# Patient Record
Sex: Male | Born: 1990 | Race: White | Hispanic: No | Marital: Married | Smoking: Never smoker
Health system: Southern US, Community
[De-identification: ages and names within clinical notes are randomized; demographics above are authoritative.]

## PROBLEM LIST (undated history)

## (undated) DIAGNOSIS — K219 Gastro-esophageal reflux disease without esophagitis: Secondary | ICD-10-CM

## (undated) DIAGNOSIS — T7840XA Allergy, unspecified, initial encounter: Secondary | ICD-10-CM

## (undated) DIAGNOSIS — F419 Anxiety disorder, unspecified: Secondary | ICD-10-CM

## (undated) DIAGNOSIS — M1990 Unspecified osteoarthritis, unspecified site: Secondary | ICD-10-CM

## (undated) DIAGNOSIS — I10 Essential (primary) hypertension: Secondary | ICD-10-CM

## (undated) DIAGNOSIS — F32A Depression, unspecified: Secondary | ICD-10-CM

## (undated) DIAGNOSIS — G4730 Sleep apnea, unspecified: Secondary | ICD-10-CM

## (undated) DIAGNOSIS — K8020 Calculus of gallbladder without cholecystitis without obstruction: Secondary | ICD-10-CM

## (undated) DIAGNOSIS — J309 Allergic rhinitis, unspecified: Secondary | ICD-10-CM

## (undated) HISTORY — DX: Essential (primary) hypertension: I10

## (undated) HISTORY — DX: Sleep apnea, unspecified: G47.30

## (undated) HISTORY — DX: Anxiety disorder, unspecified: F41.9

## (undated) HISTORY — PX: NASAL POLYP EXCISION: SHX2068

## (undated) HISTORY — DX: Allergic rhinitis, unspecified: J30.9

## (undated) HISTORY — DX: Unspecified osteoarthritis, unspecified site: M19.90

## (undated) HISTORY — DX: Allergy, unspecified, initial encounter: T78.40XA

## (undated) HISTORY — DX: Calculus of gallbladder without cholecystitis without obstruction: K80.20

## (undated) HISTORY — DX: Depression, unspecified: F32.A

## (undated) HISTORY — DX: Gastro-esophageal reflux disease without esophagitis: K21.9

---

## 2016-08-02 DIAGNOSIS — J029 Acute pharyngitis, unspecified: Secondary | ICD-10-CM | POA: Diagnosis not present

## 2016-08-02 DIAGNOSIS — R6889 Other general symptoms and signs: Secondary | ICD-10-CM | POA: Diagnosis not present

## 2016-08-02 DIAGNOSIS — Z6841 Body Mass Index (BMI) 40.0 and over, adult: Secondary | ICD-10-CM | POA: Diagnosis not present

## 2016-08-02 DIAGNOSIS — E669 Obesity, unspecified: Secondary | ICD-10-CM | POA: Diagnosis not present

## 2016-08-02 DIAGNOSIS — E559 Vitamin D deficiency, unspecified: Secondary | ICD-10-CM | POA: Diagnosis not present

## 2016-08-22 DIAGNOSIS — R221 Localized swelling, mass and lump, neck: Secondary | ICD-10-CM | POA: Diagnosis not present

## 2016-08-22 DIAGNOSIS — Z0000 Encounter for general adult medical examination without abnormal findings: Secondary | ICD-10-CM | POA: Diagnosis not present

## 2017-06-02 DIAGNOSIS — R1011 Right upper quadrant pain: Secondary | ICD-10-CM | POA: Diagnosis not present

## 2017-06-02 DIAGNOSIS — Z8379 Family history of other diseases of the digestive system: Secondary | ICD-10-CM | POA: Diagnosis not present

## 2017-06-02 DIAGNOSIS — Z1331 Encounter for screening for depression: Secondary | ICD-10-CM | POA: Diagnosis not present

## 2017-06-07 DIAGNOSIS — K760 Fatty (change of) liver, not elsewhere classified: Secondary | ICD-10-CM | POA: Diagnosis not present

## 2017-06-07 DIAGNOSIS — K8020 Calculus of gallbladder without cholecystitis without obstruction: Secondary | ICD-10-CM | POA: Diagnosis not present

## 2017-07-31 DIAGNOSIS — R7303 Prediabetes: Secondary | ICD-10-CM | POA: Diagnosis not present

## 2017-07-31 DIAGNOSIS — I10 Essential (primary) hypertension: Secondary | ICD-10-CM | POA: Diagnosis not present

## 2017-07-31 DIAGNOSIS — R42 Dizziness and giddiness: Secondary | ICD-10-CM | POA: Diagnosis not present

## 2017-08-18 DIAGNOSIS — J019 Acute sinusitis, unspecified: Secondary | ICD-10-CM | POA: Diagnosis not present

## 2017-10-03 DIAGNOSIS — J320 Chronic maxillary sinusitis: Secondary | ICD-10-CM | POA: Diagnosis not present

## 2017-10-03 DIAGNOSIS — R0681 Apnea, not elsewhere classified: Secondary | ICD-10-CM | POA: Diagnosis not present

## 2017-10-03 DIAGNOSIS — Z6841 Body Mass Index (BMI) 40.0 and over, adult: Secondary | ICD-10-CM | POA: Diagnosis not present

## 2017-10-03 DIAGNOSIS — M25511 Pain in right shoulder: Secondary | ICD-10-CM | POA: Diagnosis not present

## 2017-10-09 DIAGNOSIS — M7541 Impingement syndrome of right shoulder: Secondary | ICD-10-CM | POA: Diagnosis not present

## 2017-10-09 DIAGNOSIS — M5412 Radiculopathy, cervical region: Secondary | ICD-10-CM | POA: Diagnosis not present

## 2017-10-25 DIAGNOSIS — Z0189 Encounter for other specified special examinations: Secondary | ICD-10-CM | POA: Diagnosis not present

## 2017-10-25 DIAGNOSIS — M5412 Radiculopathy, cervical region: Secondary | ICD-10-CM | POA: Diagnosis not present

## 2017-10-31 DIAGNOSIS — G500 Trigeminal neuralgia: Secondary | ICD-10-CM | POA: Diagnosis not present

## 2017-10-31 DIAGNOSIS — M7541 Impingement syndrome of right shoulder: Secondary | ICD-10-CM | POA: Diagnosis not present

## 2017-10-31 DIAGNOSIS — M5412 Radiculopathy, cervical region: Secondary | ICD-10-CM | POA: Diagnosis not present

## 2017-11-08 DIAGNOSIS — R600 Localized edema: Secondary | ICD-10-CM | POA: Diagnosis not present

## 2017-11-08 DIAGNOSIS — M7541 Impingement syndrome of right shoulder: Secondary | ICD-10-CM | POA: Diagnosis not present

## 2017-11-08 DIAGNOSIS — M25511 Pain in right shoulder: Secondary | ICD-10-CM | POA: Diagnosis not present

## 2017-11-10 DIAGNOSIS — M7541 Impingement syndrome of right shoulder: Secondary | ICD-10-CM | POA: Diagnosis not present

## 2017-11-10 DIAGNOSIS — R600 Localized edema: Secondary | ICD-10-CM | POA: Diagnosis not present

## 2017-11-10 DIAGNOSIS — M25511 Pain in right shoulder: Secondary | ICD-10-CM | POA: Diagnosis not present

## 2017-11-16 DIAGNOSIS — M7541 Impingement syndrome of right shoulder: Secondary | ICD-10-CM | POA: Diagnosis not present

## 2017-11-16 DIAGNOSIS — R600 Localized edema: Secondary | ICD-10-CM | POA: Diagnosis not present

## 2017-11-16 DIAGNOSIS — M25511 Pain in right shoulder: Secondary | ICD-10-CM | POA: Diagnosis not present

## 2017-11-20 DIAGNOSIS — M5416 Radiculopathy, lumbar region: Secondary | ICD-10-CM | POA: Diagnosis not present

## 2017-11-20 DIAGNOSIS — M47812 Spondylosis without myelopathy or radiculopathy, cervical region: Secondary | ICD-10-CM | POA: Diagnosis not present

## 2017-11-22 DIAGNOSIS — M7541 Impingement syndrome of right shoulder: Secondary | ICD-10-CM | POA: Diagnosis not present

## 2017-11-22 DIAGNOSIS — M25511 Pain in right shoulder: Secondary | ICD-10-CM | POA: Diagnosis not present

## 2017-11-24 DIAGNOSIS — M7541 Impingement syndrome of right shoulder: Secondary | ICD-10-CM | POA: Diagnosis not present

## 2017-11-24 DIAGNOSIS — M25511 Pain in right shoulder: Secondary | ICD-10-CM | POA: Diagnosis not present

## 2017-11-27 DIAGNOSIS — M25511 Pain in right shoulder: Secondary | ICD-10-CM | POA: Diagnosis not present

## 2017-11-27 DIAGNOSIS — M7541 Impingement syndrome of right shoulder: Secondary | ICD-10-CM | POA: Diagnosis not present

## 2017-11-29 DIAGNOSIS — M545 Low back pain: Secondary | ICD-10-CM | POA: Diagnosis not present

## 2017-11-29 DIAGNOSIS — M6281 Muscle weakness (generalized): Secondary | ICD-10-CM | POA: Diagnosis not present

## 2017-11-29 DIAGNOSIS — M25551 Pain in right hip: Secondary | ICD-10-CM | POA: Diagnosis not present

## 2017-12-04 DIAGNOSIS — M6281 Muscle weakness (generalized): Secondary | ICD-10-CM | POA: Diagnosis not present

## 2017-12-04 DIAGNOSIS — M25511 Pain in right shoulder: Secondary | ICD-10-CM | POA: Diagnosis not present

## 2017-12-04 DIAGNOSIS — M545 Low back pain: Secondary | ICD-10-CM | POA: Diagnosis not present

## 2017-12-04 DIAGNOSIS — M25551 Pain in right hip: Secondary | ICD-10-CM | POA: Diagnosis not present

## 2017-12-06 ENCOUNTER — Other Ambulatory Visit: Payer: Self-pay | Admitting: Orthopedic Surgery

## 2017-12-06 DIAGNOSIS — M25511 Pain in right shoulder: Secondary | ICD-10-CM

## 2017-12-07 ENCOUNTER — Encounter: Payer: Self-pay | Admitting: Family Medicine

## 2017-12-07 ENCOUNTER — Ambulatory Visit (INDEPENDENT_AMBULATORY_CARE_PROVIDER_SITE_OTHER): Payer: BLUE CROSS/BLUE SHIELD | Admitting: Family Medicine

## 2017-12-07 VITALS — BP 144/102 | HR 80 | Temp 98.5°F | Resp 16 | Ht 70.0 in | Wt >= 6400 oz

## 2017-12-07 DIAGNOSIS — Z6841 Body Mass Index (BMI) 40.0 and over, adult: Secondary | ICD-10-CM | POA: Diagnosis not present

## 2017-12-07 DIAGNOSIS — R7303 Prediabetes: Secondary | ICD-10-CM | POA: Insufficient documentation

## 2017-12-07 DIAGNOSIS — K219 Gastro-esophageal reflux disease without esophagitis: Secondary | ICD-10-CM

## 2017-12-07 DIAGNOSIS — J301 Allergic rhinitis due to pollen: Secondary | ICD-10-CM

## 2017-12-07 DIAGNOSIS — J309 Allergic rhinitis, unspecified: Secondary | ICD-10-CM | POA: Insufficient documentation

## 2017-12-07 DIAGNOSIS — M25511 Pain in right shoulder: Secondary | ICD-10-CM | POA: Insufficient documentation

## 2017-12-07 DIAGNOSIS — R0683 Snoring: Secondary | ICD-10-CM | POA: Diagnosis not present

## 2017-12-07 DIAGNOSIS — I10 Essential (primary) hypertension: Secondary | ICD-10-CM | POA: Diagnosis not present

## 2017-12-07 DIAGNOSIS — K8020 Calculus of gallbladder without cholecystitis without obstruction: Secondary | ICD-10-CM | POA: Diagnosis not present

## 2017-12-07 DIAGNOSIS — Z13220 Encounter for screening for lipoid disorders: Secondary | ICD-10-CM | POA: Diagnosis not present

## 2017-12-07 DIAGNOSIS — R739 Hyperglycemia, unspecified: Secondary | ICD-10-CM

## 2017-12-07 DIAGNOSIS — G4719 Other hypersomnia: Secondary | ICD-10-CM | POA: Diagnosis not present

## 2017-12-07 DIAGNOSIS — Z833 Family history of diabetes mellitus: Secondary | ICD-10-CM

## 2017-12-07 DIAGNOSIS — I152 Hypertension secondary to endocrine disorders: Secondary | ICD-10-CM | POA: Insufficient documentation

## 2017-12-07 DIAGNOSIS — E119 Type 2 diabetes mellitus without complications: Secondary | ICD-10-CM | POA: Insufficient documentation

## 2017-12-07 MED ORDER — RANITIDINE HCL 150 MG PO TABS: 150.0000 mg | ORAL_TABLET | Freq: Two times a day (BID) | ORAL | 2 refills | Status: DC

## 2017-12-07 MED ORDER — HYDROCHLOROTHIAZIDE 12.5 MG PO TABS
12.5000 mg | ORAL_TABLET | Freq: Every day | ORAL | 3 refills | Status: DC
Start: 2017-12-07 — End: 2018-01-09

## 2017-12-07 NOTE — Assessment & Plan Note (Signed)
Currently uncontrolled Patient reports normal blood pressures at home, so it is possible that some of the elevation today is related to whitecoat syndrome at a new doctor's office Gave patient the choice of monitoring for 1 month or going ahead and starting a medication We will start HCTZ 12.5 mg daily and discontinue the lisinopril portion of his medication that he is not currently taking Check CMP Patient to keep a log of home blood pressures and bring cuff to next visit so we can compare to ours Follow-up in 1 month

## 2017-12-07 NOTE — Progress Notes (Signed)
Patient: Jeremy David, Male    DOB: [birth year], 27 y.o.   MRN: [id number] Visit Date: 12/07/2017  Today's Provider: Shirlee Latch, MD   I, Joslyn Hy, CMA, am acting as scribe for Shirlee Latch, MD.  Chief Complaint  Patient presents with  . New Patient (Initial Visit)   Subjective:    New Patient Fernandez Kenley is a 27 y.o. male who presents today as a new pt to establish care. He feels well. He reports exercising some; "I try to exercise".  He was previously seen by [facility] in [place].  He is now living in [place].   He reports he is sleeping poorly. Has been told he has apneic episodes and significant snoring.  Has never had a sleep study.  Previous PCP was trying to get this processed, but insurance was declining.  Epworth Sleepiness Scale:  Sitting and reading: Moderate chance of dozing Watching TV: Moderate chance of dozing Sitting, inactive in a public place (e.g. a theatre or a meeting): Slight chance of dozing As a passenger in a car for an hour without a break: Moderate chance of dozing Lying down to rest in the afternoon when circumstances permit: Moderate chance of dozing Sitting and talking to someone: Slight chance of dozing Sitting quietly after a lunch without alcohol: Slight chance of dozing In a car, while stopped for a few minutes in traffic: Slight chance of dozing  Total score: 12   Pt has a H/O HTN. He was prescribed lisinnopril-HCTZ 10-12.5 by previous PCP. He states he is not taking this medication because he believed it was ineffective. He checks his BP at home, which is normally 112/89-90. He was also informed that he has an intermittent irregular heart beat.  Was told that he had gallstones after having a RUQ [place] with previous PCP.  Had RUQ pain at that time after eating.  Has cut back on greasy food intake and spicy foods. Now RUQ pain has resolved.  No more gallbladder attacks.  Was told to hold off on surgery if possible  and wants another opinion on this.  Patient is also concerned about chest pain.  The pain is in the center of his upper chest and up into his throat.  Is been occurring intermittently for the last 2 weeks.  Seems to come on randomly and often at rest.  He notices better on days that he is more active lasts for a few hours after coming on.  He has not tried any medications for it.  It is associated with nausea that is particularly bad in the mornings, but no vomiting.  Also with dry cough and occasional diaphoresis when the pain is bad.  He has never had pain like this before, though he was having some burning in his epigastrium/right upper quadrant when eating spicy foods prior to finding out that he had gallstones.  Patient also reports intermittent dry mouth within the last month or so.  Allergic rhinitis: Patient is not currently taking any medications for this.  He finds it is seasonal and related to the pollen.  He states that after taking a lot of Allegra-D as a teenager that antihistamines do not seem to help much anymore.  Seeing [facility] for R shoulder and neck pain.  Undergoing PT and planning for MRI next week.  We will attempt to obtain records  Hyperglycemia: Patient states this is a long-standing history of intermittent hyperglycemia.  He is concerned because he has  a family history of diabetes in his mother.  He is not checking his blood sugar regularly, but did have a random blood sugar of 166 in the middle of the day last week. -----------------------------------------------------------------   Review of Systems  Constitutional: Positive for appetite change, chills, diaphoresis, fatigue and unexpected weight change. Negative for activity change and fever.  HENT: Positive for congestion, drooling, ear pain, rhinorrhea, sinus pressure, sinus pain, sneezing, tinnitus and trouble swallowing. Negative for dental problem, ear discharge, facial swelling, hearing loss, mouth sores,  nosebleeds, postnasal drip, sore throat and voice change.   Eyes: Positive for photophobia and pain. Negative for discharge, redness, itching and visual disturbance.  Respiratory: Positive for apnea, cough, choking, chest tightness and shortness of breath. Negative for wheezing and stridor.   Cardiovascular: Positive for palpitations. Negative for chest pain and leg swelling.  Gastrointestinal: Positive for nausea. Negative for abdominal distention, abdominal pain, anal bleeding, blood in stool, constipation, diarrhea, rectal pain and vomiting.  Endocrine: Positive for heat intolerance and polydipsia. Negative for cold intolerance, polyphagia and polyuria.  Genitourinary: Negative.   Musculoskeletal: Positive for arthralgias, back pain, myalgias, neck pain and neck stiffness. Negative for gait problem and joint swelling.  Skin: Negative.   Allergic/Immunologic: Positive for environmental allergies. Negative for food allergies and immunocompromised state.  Neurological: Positive for dizziness, weakness, light-headedness, numbness and headaches. Negative for tremors, seizures, syncope, facial asymmetry and speech difficulty.  Hematological: Negative.   Psychiatric/Behavioral: Positive for confusion, dysphoric mood and sleep disturbance. Negative for agitation, behavioral problems, decreased concentration, hallucinations, self-injury and suicidal ideas. The patient is nervous/anxious. The patient is not hyperactive.     Social History      He  reports that he has never smoked. He has never used smokeless tobacco. He reports that he drank alcohol. He reports that he does not use drugs.       Social History   Socioeconomic History  . Marital status: Married    Spouse name: Lillia Abed  . Number of children: 0  . Years of education: 40  . Highest education level: Some college, no degree  Occupational History    Employer: [employer]    Comment: Currently out of work due to shoulder  injury  Social Needs  . Financial resource strain: Not on file  . Food insecurity:    Worry: Not on file    Inability: Not on file  . Transportation needs:    Medical: Not on file    Non-medical: Not on file  Tobacco Use  . Smoking status: Never Smoker  . Smokeless tobacco: Never Used  Substance and Sexual Activity  . Alcohol use: Not Currently  . Drug use: Never  . Sexual activity: Yes    Partners: Female    Birth control/protection: Condom  Lifestyle  . Physical activity:    Days per week: Not on file    Minutes per session: Not on file  . Stress: Not on file  Relationships  . Social connections:    Talks on phone: Not on file    Gets together: Not on file    Attends religious service: Not on file    Active member of club or organization: Not on file    Attends meetings of clubs or organizations: Not on file    Relationship status: Not on file  Other Topics Concern  . Not on file  Social History Narrative  . Not on file    Past Medical History:  Diagnosis Date  .  Allergic rhinitis   . Gallstones   . Hypertension      Patient Active Problem List   Diagnosis Date Noted  . Hypertension 12/07/2017  . Hyperglycemia 12/07/2017  . Allergic rhinitis 12/07/2017  . Gallstones 12/07/2017  . GERD (gastroesophageal reflux disease) 12/07/2017  . Obesity 12/07/2017  . Excessive daytime sleepiness 12/07/2017  . Right shoulder pain 12/07/2017    Past Surgical History:  Procedure Laterality Date  . NASAL POLYP EXCISION      Family History        Family Status  Relation Name Status  . Mother  Alive  . Father  (Not Specified)  . Mat Aunt  (Not Specified)  . MGM  (Not Specified)  . Neg Hx  (Not Specified)        His family history includes Breast cancer in his maternal aunt; Cancer in his maternal grandmother; Diabetes in his mother; Hypertension in his father. There is no history of Colon cancer or Prostate cancer.      Allergies  Allergen Reactions  . Molds  & Smuts   . Pollen Extract Other (See Comments)    Sneezing and wheezing     Current Outpatient Medications:  .  hydrochlorothiazide (HYDRODIURIL) 12.5 MG tablet, Take 1 tablet (12.5 mg total) by mouth daily., Disp: 30 tablet, Rfl: 3 .  ranitidine (ZANTAC) 150 MG tablet, Take 1 tablet (150 mg total) by mouth 2 (two) times daily., Disp: 60 tablet, Rfl: 2   Patient Care Team: Erasmo Downer, MD as PCP - General (Family Medicine)      Objective:   Vitals: BP (!) 144/102 (BP Location: Left Arm, Patient Position: Sitting, Cuff Size: Large)   Pulse 80   Temp 98.5 F (36.9 C) (Oral)   Resp 16   Ht 5\' 10"  (1.778 m)   Wt (!) 406 lb (184.2 kg)   SpO2 97%   BMI 58.25 kg/m    Vitals:   12/07/17 0919  BP: (!) 144/102  Pulse: 80  Resp: 16  Temp: 98.5 F (36.9 C)  TempSrc: Oral  SpO2: 97%  Weight: (!) 406 lb (184.2 kg)  Height: 5\' 10"  (1.778 m)     Physical Exam  Constitutional: He is oriented to person, place, and time. He appears well-developed and well-nourished. No distress.  HENT:  Head: Normocephalic and atraumatic.  Right Ear: External ear normal.  Left Ear: External ear normal.  Nose: Mucosal edema present. No rhinorrhea. Right sinus exhibits no maxillary sinus tenderness and no frontal sinus tenderness. Left sinus exhibits no maxillary sinus tenderness and no frontal sinus tenderness.  Mouth/Throat: Oropharynx is clear and moist.  Eyes: Pupils are equal, round, and reactive to light. Conjunctivae and EOM are normal. Right eye exhibits no discharge. Left eye exhibits no discharge. No scleral icterus.  Neck: Neck supple. No thyromegaly present.  Cardiovascular: Normal rate, regular rhythm, normal heart sounds and intact distal pulses.  No murmur heard. Pulmonary/Chest: Effort normal and breath sounds normal. No respiratory distress. He has no wheezes. He has no rales.  Abdominal: Soft. Bowel sounds are normal. He exhibits no distension. There is tenderness (TTP over  epigastrium). There is no rebound and no guarding.  Musculoskeletal: He exhibits no edema or deformity.  Lymphadenopathy:    He has no cervical adenopathy.  Neurological: He is alert and oriented to person, place, and time.  Skin: Skin is warm and dry. Capillary refill takes less than 2 seconds. No rash noted.  Psychiatric: He has a normal  mood and affect. His behavior is normal.  Vitals reviewed.    Depression Screen PHQ 2/9 Scores 12/07/2017 12/07/2017  PHQ - 2 Score 0 0  PHQ- 9 Score 3 -     Assessment & Plan:    Problem List Items Addressed This Visit      Cardiovascular and Mediastinum   Hypertension - Primary    Currently uncontrolled Patient reports normal blood pressures at home, so it is possible that some of the elevation today is related to whitecoat syndrome at a new doctor's office Gave patient the choice of monitoring for 1 month or going ahead and starting a medication We will start HCTZ 12.5 mg daily and discontinue the lisinopril portion of his medication that he is not currently taking Check CMP Patient to keep a log of home blood pressures and bring cuff to next visit so we can compare to ours Follow-up in 1 month      Relevant Medications   hydrochlorothiazide (HYDRODIURIL) 12.5 MG tablet   Other Relevant Orders   Comprehensive metabolic panel     Respiratory   Allergic rhinitis    Uncontrolled but not currently taking any medications Patient reports that he has tried antihistamines multiple times and they do not seem to help He knows to avoid chronic Sudafed Could consider Flonase in the future        Digestive   Gallstones    We will attempt to obtain records from previous PCP regarding imaging He is asymptomatic currently Discussed with patient that if he is asymptomatic, as long as bile duct was not blocked, he can continue to just watch his diet and not have surgery at this time If he becomes symptomatic again in the future, we will send  referral to Gen Surg      GERD (gastroesophageal reflux disease)    The characteristics of patient's chest pain are not consistent with cardiac etiology It is reassuring that it occurs mostly at rest and gets better with activity Duration of pain is also not consistent with cardiac etiology Given that is a burning pain and associated with epigastric pain and radiates into the neck, suspect this is GERD We will try H2 blocker Could escalate PPI in the future Also suspect that some of his right upper quadrant pain and burning with spicy foods was related to GERD as well as his gallstone disease Strict return precautions discussed Follow-up in 1 month      Relevant Medications   ranitidine (ZANTAC) 150 MG tablet     Other   Hyperglycemia    Patient does not have extensive logs of blood sugars, but has been having some hypoglycemic events at home Check A1c, especially given family history of diabetes and significant obesity Treatment pending A1c value      Relevant Orders   Hemoglobin A1c   Obesity    Patient with severe obesity with BMI 58 Discussed importance of healthy weight management, diet, exercise This likely contributes to his possible sleep apnea He may be a good candidate for bariatric surgery in the future if he was interested given his comorbidities such as possible sleep apnea and hypertension      Relevant Orders   Comprehensive metabolic panel   Lipid panel   Hemoglobin A1c   Excessive daytime sleepiness    Concern for possible OSA given morbid obesity with large neck circumference, snoring, possible apneic events Discussed consequences of untreated sleep apnea Referral for sleep study      Relevant  Orders   Ambulatory referral to Sleep Studies   Right shoulder pain    Followed by [facility] in [place] We will send ROI to attempt to get records as patient is unclear about what his diagnosis is       Other Visit Diagnoses    Screening for lipid  disorders       Relevant Orders   Comprehensive metabolic panel   Lipid panel   Family history of diabetes mellitus       Relevant Orders   Hemoglobin A1c   Snoring       Relevant Orders   Ambulatory referral to Sleep Studies       Return in about 1 month (around 01/04/2018) for HTN and GERD f/u.  Will send ROI to [facility] and previous PCP   The entirety of the information documented in the History of Present Illness, Review of Systems and Physical Exam were personally obtained by me. Portions of this information were initially documented by Irving BurtonEmily Ratchford, CMA and reviewed by me for thoroughness and accuracy.    Erasmo DownerBacigalupo, Jaydien Panepinto M, MD, MPH [facility] 12/07/2017 12:04 PM

## 2017-12-07 NOTE — Assessment & Plan Note (Signed)
The characteristics of patient's chest pain are not consistent with cardiac etiology It is reassuring that it occurs mostly at rest and gets better with activity Duration of pain is also not consistent with cardiac etiology Given that is a burning pain and associated with epigastric pain and radiates into the neck, suspect this is GERD We will try H2 blocker Could escalate PPI in the future Also suspect that some of his right upper quadrant pain and burning with spicy foods was related to GERD as well as his gallstone disease Strict return precautions discussed Follow-up in 1 month

## 2017-12-07 NOTE — Assessment & Plan Note (Signed)
Concern for possible OSA given morbid obesity with large neck circumference, snoring, possible apneic events Discussed consequences of untreated sleep apnea Referral for sleep study

## 2017-12-07 NOTE — Assessment & Plan Note (Signed)
Patient with severe obesity with BMI 58 Discussed importance of healthy weight management, diet, exercise This likely contributes to his possible sleep apnea He may be a good candidate for bariatric surgery in the future if he was interested given his comorbidities such as possible sleep apnea and hypertension

## 2017-12-07 NOTE — Assessment & Plan Note (Signed)
[place] [facility] [place] We will send ROI to attempt to get records as patient is unclear about what his diagnosis is

## 2017-12-07 NOTE — Assessment & Plan Note (Signed)
We will attempt to obtain records from previous PCP regarding imaging He is asymptomatic currently Discussed with patient that if he is asymptomatic, as long as bile duct was not blocked, he can continue to just watch his diet and not have surgery at this time If he becomes symptomatic again in the future, we will send referral to Gen Surg

## 2017-12-07 NOTE — Assessment & Plan Note (Signed)
Patient does not have extensive logs of blood sugars, but has been having some hypoglycemic events at home Check A1c, especially given family history of diabetes and significant obesity Treatment pending A1c value

## 2017-12-07 NOTE — Assessment & Plan Note (Signed)
Uncontrolled but not currently taking any medications Patient reports that he has tried antihistamines multiple times and they do not seem to help He knows to avoid chronic Sudafed Could consider Flonase in the future

## 2017-12-07 NOTE — Patient Instructions (Signed)

## 2017-12-08 DIAGNOSIS — Z13220 Encounter for screening for lipoid disorders: Secondary | ICD-10-CM | POA: Diagnosis not present

## 2017-12-08 DIAGNOSIS — I10 Essential (primary) hypertension: Secondary | ICD-10-CM | POA: Diagnosis not present

## 2017-12-08 DIAGNOSIS — R739 Hyperglycemia, unspecified: Secondary | ICD-10-CM | POA: Diagnosis not present

## 2017-12-08 DIAGNOSIS — Z6841 Body Mass Index (BMI) 40.0 and over, adult: Secondary | ICD-10-CM | POA: Diagnosis not present

## 2017-12-09 LAB — COMPREHENSIVE METABOLIC PANEL
A/G RATIO: 2.1 (ref 1.2–2.2)
ALT: 55 IU/L — ABNORMAL HIGH (ref 0–44)
AST: 26 IU/L (ref 0–40)
Albumin: 4.4 g/dL (ref 3.5–5.5)
Alkaline Phosphatase: 17 IU/L — ABNORMAL LOW (ref 39–117)
BILIRUBIN TOTAL: 1.2 mg/dL (ref 0.0–1.2)
BUN/Creatinine Ratio: 11 (ref 9–20)
BUN: 8 mg/dL (ref 6–20)
CALCIUM: 9.5 mg/dL (ref 8.7–10.2)
CHLORIDE: 103 mmol/L (ref 96–106)
CO2: 19 mmol/L — ABNORMAL LOW (ref 20–29)
Creatinine, Ser: 0.76 mg/dL (ref 0.76–1.27)
GFR, EST AFRICAN AMERICAN: 144 mL/min/{1.73_m2} (ref >59)
GFR, EST NON AFRICAN AMERICAN: 124 mL/min/{1.73_m2} (ref >59)
GLOBULIN, TOTAL: 2.1 g/dL (ref 1.5–4.5)
Glucose: 106 mg/dL — ABNORMAL HIGH (ref 65–99)
POTASSIUM: 4.5 mmol/L (ref 3.5–5.2)
SODIUM: 140 mmol/L (ref 134–144)
TOTAL PROTEIN: 6.5 g/dL (ref 6.0–8.5)

## 2017-12-09 LAB — LIPID PANEL
CHOL/HDL RATIO: 5.3 ratio — AB (ref 0.0–5.0)
Cholesterol, Total: 223 mg/dL — ABNORMAL HIGH (ref 100–199)
HDL: 42 mg/dL (ref >39)
LDL Calculated: 162 mg/dL — ABNORMAL HIGH (ref 0–99)
Triglycerides: 96 mg/dL (ref 0–149)
VLDL Cholesterol Cal: 19 mg/dL (ref 5–40)

## 2017-12-09 LAB — HEMOGLOBIN A1C
ESTIMATED AVERAGE GLUCOSE: 120 mg/dL
HEMOGLOBIN A1C: 5.8 % — AB (ref 4.8–5.6)

## 2017-12-12 ENCOUNTER — Telehealth: Payer: Self-pay

## 2017-12-12 DIAGNOSIS — H5203 Hypermetropia, bilateral: Secondary | ICD-10-CM | POA: Diagnosis not present

## 2017-12-12 DIAGNOSIS — M25511 Pain in right shoulder: Secondary | ICD-10-CM | POA: Diagnosis not present

## 2017-12-12 DIAGNOSIS — R600 Localized edema: Secondary | ICD-10-CM | POA: Diagnosis not present

## 2017-12-12 DIAGNOSIS — M7541 Impingement syndrome of right shoulder: Secondary | ICD-10-CM | POA: Diagnosis not present

## 2017-12-12 NOTE — Telephone Encounter (Signed)
Pt advised and acknowledges understanding.  

## 2017-12-12 NOTE — Telephone Encounter (Signed)
-----   Message from Erasmo DownerAngela M Bacigalupo, MD sent at 12/11/2017  3:01 PM EDT ----- Normal kidney function, liver function, electrolytes, except ALT is slightly elevated. Cut back on alcohol and tylenol use.  Cholesterol is high, but not high enough to warrant medication.  We will monitor annually.  Recommend diet low in saturated fats and 150min of exercise per week.  A1c is in prediabetes range.  Recommend low carb diet and exercise.  We will recheck this every 6-12 months.  Erasmo DownerBacigalupo, Angela M, MD, MPH [facility] 12/11/2017 3:01 PM

## 2017-12-22 ENCOUNTER — Ambulatory Visit
Admission: RE | Admit: 2017-12-22 | Discharge: 2017-12-22 | Disposition: A | Discharge status: Home / Self Care | Payer: BLUE CROSS/BLUE SHIELD | Source: Ambulatory Visit | Attending: Orthopedic Surgery | Admitting: Orthopedic Surgery

## 2017-12-22 DIAGNOSIS — M25511 Pain in right shoulder: Secondary | ICD-10-CM

## 2017-12-28 ENCOUNTER — Telehealth: Payer: Self-pay | Admitting: Family Medicine

## 2017-12-28 NOTE — Telephone Encounter (Signed)
Can we order a home sleep study instead for him? Thanks!  Erasmo DownerBacigalupo, Angela M, MD, MPH [facility] 12/28/2017 3:24 PM

## 2017-12-28 NOTE — Telephone Encounter (Signed)
FYI--Insurance company denied in lab sleep study.[facility] suggest home sleep study or [facility].They have access to home sleep study device

## 2017-12-28 NOTE — Telephone Encounter (Signed)
Please review

## 2018-01-03 DIAGNOSIS — M5412 Radiculopathy, cervical region: Secondary | ICD-10-CM | POA: Diagnosis not present

## 2018-01-09 ENCOUNTER — Ambulatory Visit (INDEPENDENT_AMBULATORY_CARE_PROVIDER_SITE_OTHER): Payer: BLUE CROSS/BLUE SHIELD | Admitting: Family Medicine

## 2018-01-09 ENCOUNTER — Encounter: Payer: Self-pay | Admitting: Family Medicine

## 2018-01-09 VITALS — BP 138/92 | HR 85 | Temp 97.6°F | Resp 16 | Wt >= 6400 oz

## 2018-01-09 DIAGNOSIS — R739 Hyperglycemia, unspecified: Secondary | ICD-10-CM

## 2018-01-09 DIAGNOSIS — Z6841 Body Mass Index (BMI) 40.0 and over, adult: Secondary | ICD-10-CM

## 2018-01-09 DIAGNOSIS — I10 Essential (primary) hypertension: Secondary | ICD-10-CM | POA: Diagnosis not present

## 2018-01-09 DIAGNOSIS — K8020 Calculus of gallbladder without cholecystitis without obstruction: Secondary | ICD-10-CM

## 2018-01-09 DIAGNOSIS — R0789 Other chest pain: Secondary | ICD-10-CM | POA: Insufficient documentation

## 2018-01-09 DIAGNOSIS — K219 Gastro-esophageal reflux disease without esophagitis: Secondary | ICD-10-CM

## 2018-01-09 DIAGNOSIS — R0602 Shortness of breath: Secondary | ICD-10-CM | POA: Diagnosis not present

## 2018-01-09 DIAGNOSIS — G4733 Obstructive sleep apnea (adult) (pediatric): Secondary | ICD-10-CM | POA: Diagnosis not present

## 2018-01-09 MED ORDER — RANITIDINE HCL 150 MG PO TABS: 150.0000 mg | ORAL_TABLET | Freq: Two times a day (BID) | ORAL | 3 refills | Status: DC

## 2018-01-09 MED ORDER — HYDROCHLOROTHIAZIDE 12.5 MG PO TABS: 12.5000 mg | ORAL_TABLET | Freq: Every day | ORAL | 3 refills | Status: DC

## 2018-01-09 NOTE — Progress Notes (Signed)
Patient: Jeremy David Male    DOB: 07-Mar-1991   27 y.o.   MRN: [id number] Visit Date: 01/09/2018  Today's Provider: Shirlee Latch, MD   I, Joslyn Hy, CMA, am acting as scribe for Shirlee Latch, MD.  Chief Complaint  Patient presents with  . Hypertension  . Gastroesophageal Reflux   Subjective:    HPI      Hypertension, follow-up:  BP Readings from Last 3 Encounters:  01/09/18 (!) 138/92  12/07/17 (!) 144/102    He was last seen for hypertension 1 months ago.  BP at that visit was 144/102. Management since that visit includes starting HCTZ 12.5 mg. He reports fair compliance with treatment. Pt states he takes his medication in the late afternoons, and he admits he has not been taking medications regularly. Has taken the medications for the past week. He is not having side effects.  He is not exercising. He is not adherent to low salt diet.   Outside blood pressures are DBP are in the 40's according to his cuff. He believes this is inaccurate. He is experiencing chest pain and dyspnea.  Chest wall is tender to palpation.  Feels connected to neck and shoulder.  Waxes and wanes.  Seems to have been present for a few weeks. Patient denies chest pressure/discomfort, claudication, exertional chest pressure/discomfort, fatigue, irregular heart beat, lower extremity edema, near-syncope, orthopnea, palpitations and syncope.   Cardiovascular risk factors include hypertension, male gender and obesity (BMI >= 30 kg/m2).  Use of agents associated with hypertension: none.     Weight trend: decreasing steadily Wt Readings from Last 3 Encounters:  01/09/18 (!) 402 lb (182.3 kg)  12/07/17 (!) 406 lb (184.2 kg)    Current diet: is avoiding pork. Is trying to eat more chicken, fish and [place]  ------------------------------------------------------------------------  GERD, Follow up:  The patient was last seen for GERD 1 months ago. Changes made since that visit  include adding Zantac 150 mg.  He reports poor compliance with treatment. He does not take this medication regularly, but does notice a great difference when he does take the Zantac.  He is not having side effects. Marland Kitchen  He IS experiencing abdominal bloating, belching, chest pain, choking on food, cough, dysphagia, early satiety, heartburn, nausea, need to clear throat frequently and toungue dryness, as well as "sticky saliva". He is NOT experiencing midespigastric pain  ------------------------------------------------------------------------ He was previously approved to see a dietician for meal planning.  He would like a referral.  He is also concerned about visible swelling of RUQ.  States that his wife can see this as well.  He has h/o gllstones but is no longer having the same pain, so he did not have surgery.  He is tender in this area, but pain is different.  Denies epigastric pain, fever, N/V/D, constipation.   Allergies  Allergen Reactions  . Molds & Smuts   . Pollen Extract Other (See Comments)    Sneezing and wheezing     Current Outpatient Medications:  .  hydrochlorothiazide (HYDRODIURIL) 12.5 MG tablet, Take 1 tablet (12.5 mg total) by mouth daily., Disp: 30 tablet, Rfl: 3 .  ranitidine (ZANTAC) 150 MG tablet, Take 1 tablet (150 mg total) by mouth 2 (two) times daily., Disp: 60 tablet, Rfl: 2  Review of Systems  Constitutional: Negative for activity change, appetite change, chills, diaphoresis, fatigue, fever and unexpected weight change.  HENT: Negative.   Respiratory: Positive for cough, chest tightness (sometimes) and shortness of breath (  sometimes).   Cardiovascular: Positive for chest pain. Negative for palpitations and leg swelling.  Gastrointestinal: Negative.   Genitourinary: Negative.   Musculoskeletal: Negative.   Skin: Negative.   Neurological: Negative.   Psychiatric/Behavioral: Negative.     Social History   Tobacco Use  . Smoking status: Never Smoker   . Smokeless tobacco: Never Used  Substance Use Topics  . Alcohol use: Not Currently   Objective:   BP (!) 138/92 (BP Location: Left Arm, Patient Position: Sitting, Cuff Size: Large)   Pulse 85   Temp 97.6 F (36.4 C) (Oral)   Resp 16   Wt (!) 402 lb (182.3 kg)   SpO2 97%   BMI 57.68 kg/m  Vitals:   01/09/18 0811  BP: (!) 138/92  Pulse: 85  Resp: 16  Temp: 97.6 F (36.4 C)  TempSrc: Oral  SpO2: 97%  Weight: (!) 402 lb (182.3 kg)     Physical Exam  Constitutional: He is oriented to person, place, and time. He appears well-developed and well-nourished. No distress.  HENT:  Head: Normocephalic and atraumatic.  Eyes: Conjunctivae are normal.  Neck: Neck supple. No thyromegaly present.  Cardiovascular: Normal rate, regular rhythm, normal heart sounds and intact distal pulses.  No murmur heard. Pulmonary/Chest: Effort normal and breath sounds normal. No respiratory distress. He has no wheezes. He has no rales. He exhibits tenderness.  Abdominal: Soft. He exhibits no distension. There is tenderness (in RUQ).  No visible swelling   Musculoskeletal: He exhibits no edema.  Lymphadenopathy:    He has no cervical adenopathy.  Neurological: He is alert and oriented to person, place, and time.  Skin: Skin is warm and dry. Capillary refill takes less than 2 seconds. No rash noted.  Psychiatric: He has a normal mood and affect. His behavior is normal.  Vitals reviewed.      Assessment & Plan:   Problem List Items Addressed This Visit      Cardiovascular and Mediastinum   Hypertension - Primary    Currently uncontrolled but has not taken medication consistently Will continue HCTZ 12.5mg  daily Reviewed last CMP Patient to try new home BP monitor F/u in 3 months or sooner if needed      Relevant Medications   hydrochlorothiazide (HYDRODIURIL) 12.5 MG tablet   Other Relevant Orders   Amb ref to Medical Nutrition Therapy-MNT     Digestive   Gallstones    Never received  records from previous PCP He is now having tenderness and ?swelling Will get RUQ US Pending results, consider Gen Surg referral      Relevant Orders   US Abdomen Limited RUQ   GERD (gastroesophageal reflux disease)    Well controlled  Continue H2 blocker      Relevant Medications   ranitidine (ZANTAC) 150 MG tablet   Other Relevant Orders   Amb ref to Medical Nutrition Therapy-MNT     Other   Hyperglycemia    Referral to nutrition Recheck A1c at next visit      Relevant Orders   Amb ref to Medical Nutrition Therapy-MNT   Obesity    Severe obesity with BMI 57.7 Discussed importance of healthy weight management, diet, exercise Referral to nutrition      Relevant Orders   Amb ref to Medical Nutrition Therapy-MNT   Chest wall pain    Patient with some tenderness to palpation over anterior chest wall Discussed musculoskeletal pain and symptomatic management No symptoms consistent with any cardiac etiology for chest pain  Return in about 3 months (around 04/11/2018) for chronic disease f/u.   The entirety of the information documented in the History of Present Illness, Review of Systems and Physical Exam were personally obtained by me. Portions of this information were initially documented by Irving BurtonEmily Ratchford, CMA and reviewed by me for thoroughness and accuracy.    Erasmo DownerBacigalupo, Angela M, MD, MPH [facility] 01/09/2018 9:37 AM

## 2018-01-09 NOTE — Patient Instructions (Signed)
Chest Wall Pain °Chest wall pain is pain in or around the bones and muscles of your chest. Sometimes, an injury causes this pain. Sometimes, the cause may not be known. This pain may take several weeks or longer to get better. °Follow these instructions at home: °Pay attention to any changes in your symptoms. Take these actions to help with your pain: °· Rest as told by your health care provider. °· Avoid activities that cause pain. These include any activities that use your chest muscles or your abdominal and side muscles to lift heavy items. °· If directed, apply ice to the painful area: °? Put ice in a plastic bag. °? Place a towel between your skin and the bag. °? Leave the ice on for 20 minutes, 2-3 times per day. °· Take over-the-counter and prescription medicines only as told by your health care provider. °· Do not use tobacco products, including cigarettes, chewing tobacco, and e-cigarettes. If you need help quitting, ask your health care provider. °· Keep all follow-up visits as told by your health care provider. This is important. ° °Contact a health care provider if: °· You have a fever. °· Your chest pain becomes worse. °· You have new symptoms. °Get help right away if: °· You have nausea or vomiting. °· You feel sweaty or light-headed. °· You have a cough with phlegm (sputum) or you cough up blood. °· You develop shortness of breath. °This information is not intended to replace advice given to you by your health care provider. Make sure you discuss any questions you have with your health care provider. °Document Released: 05/16/2005 Document Revised: 09/24/2015 Document Reviewed: 08/11/2014 °Elsevier Interactive Patient Education © 2018 Elsevier Inc. ° °

## 2018-01-09 NOTE — Assessment & Plan Note (Signed)
Severe obesity with BMI 57.7 Discussed importance of healthy weight management, diet, exercise Referral to nutrition

## 2018-01-09 NOTE — Assessment & Plan Note (Signed)
Referral to nutrition Recheck A1c at next visit

## 2018-01-09 NOTE — Assessment & Plan Note (Signed)
Well controlled Continue H2 blocker 

## 2018-01-09 NOTE — Assessment & Plan Note (Signed)
Patient with some tenderness to palpation over anterior chest wall Discussed musculoskeletal pain and symptomatic management No symptoms consistent with any cardiac etiology for chest pain

## 2018-01-09 NOTE — Assessment & Plan Note (Signed)
Currently uncontrolled but has not taken medication consistently Will continue HCTZ 12.5mg  daily Reviewed last CMP Patient to try new home BP monitor F/u in 3 months or sooner if needed

## 2018-01-09 NOTE — Assessment & Plan Note (Signed)
Never received records from previous PCP He is now having tenderness and ?swelling Will get RUQ US Pending results, consider Gen Surg referral

## 2018-01-10 DIAGNOSIS — R0602 Shortness of breath: Secondary | ICD-10-CM | POA: Diagnosis not present

## 2018-01-10 DIAGNOSIS — G4733 Obstructive sleep apnea (adult) (pediatric): Secondary | ICD-10-CM | POA: Diagnosis not present

## 2018-01-11 ENCOUNTER — Ambulatory Visit
Admission: RE | Admit: 2018-01-11 | Discharge: 2018-01-11 | Disposition: A | Discharge status: Home / Self Care | Payer: BLUE CROSS/BLUE SHIELD | Source: Ambulatory Visit | Attending: Orthopedic Surgery | Admitting: Orthopedic Surgery

## 2018-01-11 DIAGNOSIS — M19011 Primary osteoarthritis, right shoulder: Secondary | ICD-10-CM | POA: Insufficient documentation

## 2018-01-11 DIAGNOSIS — M25511 Pain in right shoulder: Secondary | ICD-10-CM | POA: Insufficient documentation

## 2018-01-11 MED ORDER — GADOBENATE DIMEGLUMINE 529 MG/ML IV SOLN: 5.0000 mL | Freq: Once | INTRAVENOUS | Status: DC | PRN

## 2018-01-11 MED ORDER — LIDOCAINE HCL (PF) 1 % IJ SOLN
5.0000 mL | Freq: Once | INTRAMUSCULAR | Status: AC
  Administered 2018-01-11: 5 mL
  Filled 2018-01-11: qty 5

## 2018-01-11 MED ORDER — IOPAMIDOL (ISOVUE-200) INJECTION 41%
15.0000 mL | Freq: Once | INTRAVENOUS | Status: AC | PRN
  Administered 2018-01-11: 15 mL
  Filled 2018-01-11: qty 50

## 2018-01-11 MED ORDER — SODIUM CHLORIDE 0.9 % IJ SOLN
20.0000 mL | Freq: Once | INTRAMUSCULAR | Status: AC
  Administered 2018-01-11: 20 mL via INTRAVENOUS

## 2018-01-11 MED ORDER — GADOBENATE DIMEGLUMINE 529 MG/ML IV SOLN
0.1000 mL | Freq: Once | INTRAVENOUS | Status: AC | PRN
  Administered 2018-01-11: 0.1 mL via INTRA_ARTICULAR

## 2018-01-15 ENCOUNTER — Telehealth: Payer: Self-pay

## 2018-01-15 ENCOUNTER — Other Ambulatory Visit: Payer: Self-pay | Admitting: Family Medicine

## 2018-01-15 ENCOUNTER — Ambulatory Visit
Admission: RE | Admit: 2018-01-15 | Discharge: 2018-01-15 | Disposition: A | Discharge status: Home / Self Care | Payer: BLUE CROSS/BLUE SHIELD | Source: Ambulatory Visit | Attending: Family Medicine | Admitting: Family Medicine

## 2018-01-15 DIAGNOSIS — K760 Fatty (change of) liver, not elsewhere classified: Secondary | ICD-10-CM | POA: Insufficient documentation

## 2018-01-15 DIAGNOSIS — K8020 Calculus of gallbladder without cholecystitis without obstruction: Secondary | ICD-10-CM

## 2018-01-15 NOTE — Telephone Encounter (Signed)
-----   Message from Erasmo DownerAngela M Bacigalupo, MD sent at 01/15/2018 11:06 AM EDT ----- Gallbladder ultrasound shows fatty liver and gallstones.  Nothing is obstructing.  Can place referral to surgery if he is symptomatic and would like to discuss options.  Let me know.  Erasmo DownerBacigalupo, Angela M, MD, MPH [facility] 01/15/2018 11:06 AM

## 2018-01-15 NOTE — Telephone Encounter (Signed)
Patient advised. He states he is not having any problems at this time. He will call back if he needs referral.

## 2018-01-16 DIAGNOSIS — S46911D Strain of unspecified muscle, fascia and tendon at shoulder and upper arm level, right arm, subsequent encounter: Secondary | ICD-10-CM | POA: Diagnosis not present

## 2018-01-16 DIAGNOSIS — M7541 Impingement syndrome of right shoulder: Secondary | ICD-10-CM | POA: Diagnosis not present

## 2018-01-31 DIAGNOSIS — R739 Hyperglycemia, unspecified: Secondary | ICD-10-CM | POA: Diagnosis not present

## 2018-01-31 DIAGNOSIS — G4733 Obstructive sleep apnea (adult) (pediatric): Secondary | ICD-10-CM | POA: Diagnosis not present

## 2018-01-31 DIAGNOSIS — I10 Essential (primary) hypertension: Secondary | ICD-10-CM | POA: Diagnosis not present

## 2018-02-01 DIAGNOSIS — M47812 Spondylosis without myelopathy or radiculopathy, cervical region: Secondary | ICD-10-CM | POA: Diagnosis not present

## 2018-02-06 DIAGNOSIS — M7541 Impingement syndrome of right shoulder: Secondary | ICD-10-CM | POA: Diagnosis not present

## 2018-02-06 DIAGNOSIS — G4733 Obstructive sleep apnea (adult) (pediatric): Secondary | ICD-10-CM | POA: Diagnosis not present

## 2018-02-06 DIAGNOSIS — M25511 Pain in right shoulder: Secondary | ICD-10-CM | POA: Diagnosis not present

## 2018-02-06 DIAGNOSIS — R609 Edema, unspecified: Secondary | ICD-10-CM | POA: Diagnosis not present

## 2018-02-08 DIAGNOSIS — M25511 Pain in right shoulder: Secondary | ICD-10-CM | POA: Diagnosis not present

## 2018-02-08 DIAGNOSIS — M7541 Impingement syndrome of right shoulder: Secondary | ICD-10-CM | POA: Diagnosis not present

## 2018-02-08 DIAGNOSIS — R609 Edema, unspecified: Secondary | ICD-10-CM | POA: Diagnosis not present

## 2018-02-09 ENCOUNTER — Encounter: Payer: BLUE CROSS/BLUE SHIELD | Attending: Family Medicine | Admitting: Dietician

## 2018-02-09 ENCOUNTER — Encounter: Payer: Self-pay | Admitting: Dietician

## 2018-02-09 VITALS — Ht 70.0 in | Wt >= 6400 oz

## 2018-02-09 DIAGNOSIS — K219 Gastro-esophageal reflux disease without esophagitis: Secondary | ICD-10-CM | POA: Diagnosis not present

## 2018-02-09 DIAGNOSIS — I10 Essential (primary) hypertension: Secondary | ICD-10-CM | POA: Diagnosis not present

## 2018-02-09 DIAGNOSIS — R739 Hyperglycemia, unspecified: Secondary | ICD-10-CM | POA: Diagnosis not present

## 2018-02-09 DIAGNOSIS — Z6841 Body Mass Index (BMI) 40.0 and over, adult: Secondary | ICD-10-CM

## 2018-02-09 NOTE — Progress Notes (Signed)
Medical Nutrition Therapy: Visit start time: 0930  end time: 1030  Assessment:  Diagnosis: obesity, HTN, hyperglycemia, GERD Past medical history: sleep apnea Psychosocial issues/ stress concerns: some work-related stress; patient has been out of work for 6 months due to neck injury  Preferred learning method:  [place] . Hands-on   Current weight: 404.1lbs Height: 5'10" Medications, supplements: ranitidine, BP med (patient cannot recall name)  Progress and evaluation: Patient reports about 15lb weight loss since making changes to reduce sugary beverages and some sweets; drinking more water. Has tried  hydroxycut in the past, with only short term success. Was last at goal weight of 250lbs during high school. Patient and spouse share cooking, often quick meals at supper due to his first shift and her 3rd shift schedules. Patient states he likes many foods, wife is picky eater.   Physical activity: PT 60 minutes, 2x a week, trying to increase walking  Dietary Intake:  Usual eating pattern includes 3 meals and 1 snacks per day. Dining out frequency: 3-4 meals per week.  Breakfast: cereal; breakfast bowl (microwaveable) Snack: usually none Lunch: recently salads; or noodle bowl Snack: if very hungry or stressed -- chips Supper: varies-- frozen shrimp, chicken, occasional pork (not much red meat) + rice, potatoes, mac and cheese, etc sometimes with vegetables Snack: sometimes small brownie or similar Beverages: water mostly, some juices, occasional sweet tea or hot tea  Nutrition Care Education: Topics covered: weight management, hypertension Basic nutrition: basic food groups, appropriate nutrient balance, appropriate meal and snack schedule, general nutrition guidelines    Weight control: benefits of weight control, importance of low fat and low sugar food choices, portion control, basic 2000kcal meal plan for weight loss, benefits of increasing fiber, options for balanced meal, role of  physical activity, benefits of tracking food intake hyperglycemia: appropriate carb intake and balance Hypertension: identifying high sodium foods (processed foods), identifying food sources of potassium, magnesium, weight loss    Nutritional Diagnosis:  [place]-2.2 Altered nutrition-related laboratory As related to hyperglycemia, hypertension.  As evidenced by HbA1C of 5.8%, patient report of medical history, MD notes. [place]-3.3 Overweight/obesity As related to history of excess calories and recent inactivity.  As evidenced by patient with BMI of 57.7 .  Intervention: Instruction as noted above.   Patient has already made positive lifestyle changes to begin working on weight loss.   Set goals with direction from patient.    He plans to begin using Fitbit device in the near future.  Education Materials given:  . Plate Planner with food lists . Sample menus . Goals/ instructions   Learner/ who was taught:  . Patient    Level of understanding: Marland Kitchen Verbalizes/ demonstrates competency   Demonstrated degree of understanding via:   Teach back Learning barriers: . None  Willingness to learn/ readiness for change: . Eager, change in progress   Monitoring and Evaluation:  Dietary intake, exercise, HTN, blood sugar, and body weight      follow up: 03/16/18

## 2018-02-09 NOTE — Patient Instructions (Signed)
   Keep up the good changes of reducing sugary drinks and limiting high fat foods, great job!  Continue to gradually build physical exercise with walking or other enjoyable exercise.   Use menus and foods lists to plan and eat balanced meals.

## 2018-02-12 DIAGNOSIS — M47812 Spondylosis without myelopathy or radiculopathy, cervical region: Secondary | ICD-10-CM | POA: Diagnosis not present

## 2018-02-14 DIAGNOSIS — R600 Localized edema: Secondary | ICD-10-CM | POA: Diagnosis not present

## 2018-02-14 DIAGNOSIS — M7541 Impingement syndrome of right shoulder: Secondary | ICD-10-CM | POA: Diagnosis not present

## 2018-02-14 DIAGNOSIS — M25511 Pain in right shoulder: Secondary | ICD-10-CM | POA: Diagnosis not present

## 2018-02-16 DIAGNOSIS — M7541 Impingement syndrome of right shoulder: Secondary | ICD-10-CM | POA: Diagnosis not present

## 2018-02-16 DIAGNOSIS — M25511 Pain in right shoulder: Secondary | ICD-10-CM | POA: Diagnosis not present

## 2018-02-16 DIAGNOSIS — R600 Localized edema: Secondary | ICD-10-CM | POA: Diagnosis not present

## 2018-02-21 DIAGNOSIS — M25511 Pain in right shoulder: Secondary | ICD-10-CM | POA: Diagnosis not present

## 2018-02-21 DIAGNOSIS — M7541 Impingement syndrome of right shoulder: Secondary | ICD-10-CM | POA: Diagnosis not present

## 2018-02-27 DIAGNOSIS — R600 Localized edema: Secondary | ICD-10-CM | POA: Diagnosis not present

## 2018-02-27 DIAGNOSIS — M7541 Impingement syndrome of right shoulder: Secondary | ICD-10-CM | POA: Diagnosis not present

## 2018-02-27 DIAGNOSIS — M25511 Pain in right shoulder: Secondary | ICD-10-CM | POA: Diagnosis not present

## 2018-03-02 DIAGNOSIS — I10 Essential (primary) hypertension: Secondary | ICD-10-CM | POA: Diagnosis not present

## 2018-03-02 DIAGNOSIS — G4733 Obstructive sleep apnea (adult) (pediatric): Secondary | ICD-10-CM | POA: Diagnosis not present

## 2018-03-02 DIAGNOSIS — R739 Hyperglycemia, unspecified: Secondary | ICD-10-CM | POA: Diagnosis not present

## 2018-03-16 ENCOUNTER — Ambulatory Visit: Payer: BLUE CROSS/BLUE SHIELD | Admitting: Dietician

## 2018-03-21 ENCOUNTER — Ambulatory Visit (INDEPENDENT_AMBULATORY_CARE_PROVIDER_SITE_OTHER): Payer: BLUE CROSS/BLUE SHIELD | Admitting: Family Medicine

## 2018-03-21 ENCOUNTER — Encounter: Payer: Self-pay | Admitting: Family Medicine

## 2018-03-21 VITALS — BP 142/88 | HR 84 | Temp 98.5°F | Resp 16 | Wt >= 6400 oz

## 2018-03-21 DIAGNOSIS — R1013 Epigastric pain: Secondary | ICD-10-CM | POA: Diagnosis not present

## 2018-03-21 MED ORDER — TRAMADOL HCL 50 MG PO TABS: 50.0000 mg | ORAL_TABLET | Freq: Three times a day (TID) | ORAL | 0 refills | Status: AC | PRN

## 2018-03-21 NOTE — Progress Notes (Signed)
Patient: Jeremy David Male    DOB: 02-11-1991   27 y.o.   MRN: [id number] Visit Date: 03/21/2018  Today's Provider: Shirlee Latch, MD   Chief Complaint  Patient presents with  . Abdominal Pain    LUQ Started three days ago.   Subjective:    Abdominal Pain  This is a new problem. The current episode started in the past 7 days. The problem occurs constantly. The problem has been gradually worsening. The pain is located in the LLQ. The quality of the pain is sharp, burning and aching. The abdominal pain radiates to the back. Associated symptoms include constipation and nausea. Pertinent negatives include no diarrhea, headaches or vomiting. The pain is aggravated by certain positions. The pain is relieved by nothing. He has tried nothing for the symptoms.    Last BM this AM but had been a few days prior to this morning.  Hard stool.Pain helped some with BM.  Feels bloated, especially after eating.  Known cholelithiasis.  Right upper quadrant ultrasound on 01/15/18 showed cholelithiasis without any biliary dilation or cholecystitis and hepatic steatosis    Allergies  Allergen Reactions  . Bee Venom Swelling  . Peanut-Containing Drug Products     Has felt throat "closing" after eating peanuts  . Molds & Smuts   . Pollen Extract Other (See Comments)    Sneezing and wheezing     Current Outpatient Medications:  .  hydrochlorothiazide (MICROZIDE) 12.5 MG capsule, Take 12.5 mg by mouth daily., Disp: , Rfl:  .  ranitidine (ZANTAC) 150 MG tablet, Take 1 tablet (150 mg total) by mouth 2 (two) times daily., Disp: 180 tablet, Rfl: 3  Review of Systems  Constitutional: Negative.   HENT: Negative.   Respiratory: Negative.   Cardiovascular: Negative.   Gastrointestinal: Positive for abdominal distention, abdominal pain, constipation and nausea. Negative for anal bleeding, blood in stool, diarrhea, rectal pain and vomiting.  Genitourinary: Negative.   Neurological: Positive for  dizziness and light-headedness. Negative for headaches.  Psychiatric/Behavioral: Negative.     Social History   Tobacco Use  . Smoking status: Never Smoker  . Smokeless tobacco: Never Used  Substance Use Topics  . Alcohol use: Yes    Alcohol/week: 1.0 standard drinks    Types: 1 Standard drinks or equivalent per week   Objective:   BP (!) 142/88 (BP Location: Right Arm, Patient Position: Sitting, Cuff Size: Large)   Pulse 84   Temp 98.5 F (36.9 C) (Oral)   Resp 16   Wt (!) 407 lb (184.6 kg)   BMI 58.40 kg/m  Vitals:   03/21/18 1000  BP: (!) 142/88  Pulse: 84  Resp: 16  Temp: 98.5 F (36.9 C)  TempSrc: Oral  Weight: (!) 407 lb (184.6 kg)     Physical Exam  Constitutional: He is oriented to person, place, and time. He appears well-developed and well-nourished. He does not appear ill. No distress.  HENT:  Head: Normocephalic and atraumatic.  Mouth/Throat: Oropharynx is clear and moist.  Eyes: Pupils are equal, round, and reactive to light. No scleral icterus.  Cardiovascular: Normal rate, regular rhythm, normal heart sounds and intact distal pulses.  No murmur heard. Pulmonary/Chest: Effort normal and breath sounds normal. He has no wheezes. He has no rhonchi.  Abdominal: Soft. Normal appearance and bowel sounds are normal. He exhibits no distension and no ascites. There is tenderness in the epigastric area and left upper quadrant. There is no rigidity, no rebound, no  guarding, no CVA tenderness and no tenderness at McBurney's point.  Neurological: He is alert and oriented to person, place, and time.  Skin: Skin is warm and dry. Capillary refill takes less than 2 seconds. No rash noted.  Psychiatric: He has a normal mood and affect. His behavior is normal.  Vitals reviewed.       Assessment & Plan:   1. Epigastric pain - new problem - given location of pain and TTP, suspect pancreatitis, likely from gallstones - will check CMP and lipase - will check [place] abd to  ensure there is no bile duct obstruction - discussed strict return/ED precautions - tramadol prn for pain - bland diet - Lipase - Comprehensive metabolic panel - US Abdomen Complete; Future    Meds ordered this encounter  Medications  . traMADol (ULTRAM) 50 MG tablet    Sig: Take 1-2 tablets (50-100 mg total) by mouth every 8 (eight) hours as needed for up to 5 days.    Dispense:  30 tablet    Refill:  0     Return if symptoms worsen or fail to improve.   The entirety of the information documented in the History of Present Illness, Review of Systems and Physical Exam were personally obtained by me. Portions of this information were initially documented by Kavin Leech, CMA and reviewed by me for thoroughness and accuracy.    Erasmo Downer, MD, MPH [facility] 03/22/2018 12:55 PM

## 2018-03-21 NOTE — Patient Instructions (Signed)
Acute Pancreatitis  Acute pancreatitis is a condition in which the pancreas suddenly becomes irritated and swollen (has inflammation). The pancreas is a gland that is located behind the stomach. It produces enzymes that help to digest food. The pancreas also releases the hormones glucagon and insulin, which help to regulate blood sugar. Damage to the pancreas occurs when the digestive enzymes from the pancreas are activated before they are released into the intestine.  Most acute attacks last a couple of days and can cause serious problems. Some people become dehydrated and develop low blood pressure. In severe cases, bleeding into the pancreas can lead to shock and can be life-threatening. The lungs, heart, and kidneys may fail.  What are the causes?  The most common causes of this condition are:  · Alcohol abuse.  · Gallstones.    Other causes include:  · Certain medicines.  · Exposure to certain chemicals.  · Infection.  · Damage caused by an accident (trauma).  · Abdominal surgery.    In some cases, the cause may not be known.  What are the signs or symptoms?  Symptoms of this condition include:  · Pain in the upper abdomen that may radiate to the back.  · Tenderness and swelling of the abdomen.  · Nausea and vomiting.    How is this diagnosed?  This condition may be diagnosed based on:  · A physical exam.  · Blood tests.  · Imaging tests, such as X-rays, CT scans, or an ultrasound of the abdomen.    How is this treated?  Treatment for this condition usually requires a stay in the hospital. Treatment may include:  · Pain medicine.  · Fluid replacement through an IV tube.  · Placing a tube in the stomach to remove stomach contents and to control vomiting (NG tube, or nasogastric tube).  · Not eating for 3-4 days. This gives the pancreas a rest, because enzymes are not being produced that can cause further damage.  · Antibiotic medicines, if your condition is caused by an infection.  · Surgery on the pancreas or  gallbladder.    Follow these instructions at home:  Eating and drinking  · Follow instructions from your health care provider about diet. This may involve avoiding alcohol and decreasing the amount of fat in your diet.  · Eat smaller, more frequent meals. This reduces the amount of digestive fluids that the pancreas produces.  · Drink enough fluid to keep your urine clear or pale yellow.  · Do not drink alcohol if it caused your condition.  General instructions  · Take over-the-counter and prescription medicines only as told by your health care provider.  · Do not use any tobacco products, such as cigarettes, chewing tobacco, and e-cigarettes. If you need help quitting, ask your health care provider.  · Get plenty of rest.  · If directed, check your blood sugar at home as told by your health care provider.  · Keep all follow-up visits as told by your health care provider. This is important.  Contact a health care provider if:  · You do not recover as quickly as expected.  · You develop new or worsening symptoms.  · You have persistent pain, weakness, or nausea.  · You recover and then have another episode of pain.  · You have a fever.  Get help right away if:  · You cannot eat or keep fluids down.  · Your pain becomes severe.  · Your skin or the   white part of your eyes turns yellow (jaundice).  · You vomit.  · You feel dizzy or you faint.  · Your blood sugar is high (over 300 mg/dL).  This information is not intended to replace advice given to you by your health care provider. Make sure you discuss any questions you have with your health care provider.  Document Released: 05/16/2005 Document Revised: 09/23/2015 Document Reviewed: 02/17/2015  Elsevier Interactive Patient Education © 2018 Elsevier Inc.

## 2018-03-22 ENCOUNTER — Telehealth: Payer: Self-pay

## 2018-03-22 LAB — COMPREHENSIVE METABOLIC PANEL
A/G RATIO: 2.2 (ref 1.2–2.2)
ALT: 72 IU/L — AB (ref 0–44)
AST: 36 IU/L (ref 0–40)
Albumin: 4.3 g/dL (ref 3.5–5.5)
Alkaline Phosphatase: 19 IU/L — ABNORMAL LOW (ref 39–117)
BUN/Creatinine Ratio: 9 (ref 9–20)
BUN: 8 mg/dL (ref 6–20)
Bilirubin Total: 1.2 mg/dL (ref 0.0–1.2)
CALCIUM: 9.5 mg/dL (ref 8.7–10.2)
CO2: 21 mmol/L (ref 20–29)
Chloride: 106 mmol/L (ref 96–106)
Creatinine, Ser: 0.85 mg/dL (ref 0.76–1.27)
GFR, EST AFRICAN AMERICAN: 136 mL/min/{1.73_m2} (ref >59)
GFR, EST NON AFRICAN AMERICAN: 118 mL/min/{1.73_m2} (ref >59)
Globulin, Total: 2 g/dL (ref 1.5–4.5)
Glucose: 115 mg/dL — ABNORMAL HIGH (ref 65–99)
POTASSIUM: 4.3 mmol/L (ref 3.5–5.2)
Sodium: 141 mmol/L (ref 134–144)
TOTAL PROTEIN: 6.3 g/dL (ref 6.0–8.5)

## 2018-03-22 LAB — LIPASE: LIPASE: 17 U/L (ref 13–78)

## 2018-03-22 NOTE — Telephone Encounter (Signed)
Patient was advised he states that his pain is still present, he is taking Tramadol PRN. Please advise. KW

## 2018-03-22 NOTE — Telephone Encounter (Signed)
-----   Message from Erasmo Downer, MD sent at 03/22/2018  4:27 PM EDT ----- Lipase, pancreatic enzyme, is actually in normal range.  Wonder how his pain is doing.  His kidney function, liver function, electrolytes are all stable.  Erasmo Downer, MD, MPH [facility] 03/22/2018 4:27 PM

## 2018-03-23 ENCOUNTER — Telehealth: Payer: Self-pay

## 2018-03-23 ENCOUNTER — Ambulatory Visit
Admission: RE | Admit: 2018-03-23 | Discharge: 2018-03-23 | Disposition: A | Discharge status: Home / Self Care | Payer: BLUE CROSS/BLUE SHIELD | Source: Ambulatory Visit | Attending: Family Medicine | Admitting: Family Medicine

## 2018-03-23 DIAGNOSIS — R1013 Epigastric pain: Secondary | ICD-10-CM | POA: Diagnosis not present

## 2018-03-23 DIAGNOSIS — K8020 Calculus of gallbladder without cholecystitis without obstruction: Secondary | ICD-10-CM

## 2018-03-23 NOTE — Telephone Encounter (Signed)
LMTCB

## 2018-03-23 NOTE — Telephone Encounter (Signed)
Patient advised.

## 2018-03-23 NOTE — Telephone Encounter (Signed)
-----   Message from Erasmo Downer, MD sent at 03/23/2018  1:18 PM EDT ----- Gallstones again noted.  No acute changes.  Has patient seen a surgeon to discuss symptomatic gallstones?  Erasmo Downer, MD, MPH [facility] 03/23/2018 1:18 PM

## 2018-03-23 NOTE — Telephone Encounter (Signed)
Pt returned missed call.  Please call pt back. ° °Thanks, °TGH °

## 2018-03-23 NOTE — Telephone Encounter (Signed)
Continue that treatment. If still feeling unwell next week, we can reassess.  Erasmo Downer, MD, MPH [facility] 03/23/2018 8:32 AM

## 2018-03-26 ENCOUNTER — Encounter: Payer: Self-pay | Admitting: *Deleted

## 2018-03-26 NOTE — Telephone Encounter (Signed)
Patient advised. He states he has not been seen by a surgeon previously. He request to proceed with referral at this time.

## 2018-03-26 NOTE — Telephone Encounter (Signed)
Referral placed  Bacigalupo, Marzella Schlein, MD, MPH [facility] 03/26/2018 9:50 AM

## 2018-03-29 ENCOUNTER — Encounter: Payer: Self-pay | Admitting: Dietician

## 2018-03-29 ENCOUNTER — Encounter: Payer: BLUE CROSS/BLUE SHIELD | Attending: Family Medicine | Admitting: Dietician

## 2018-03-29 VITALS — Ht 70.0 in | Wt >= 6400 oz

## 2018-03-29 DIAGNOSIS — Z6841 Body Mass Index (BMI) 40.0 and over, adult: Secondary | ICD-10-CM | POA: Diagnosis not present

## 2018-03-29 DIAGNOSIS — I10 Essential (primary) hypertension: Secondary | ICD-10-CM

## 2018-03-29 DIAGNOSIS — K219 Gastro-esophageal reflux disease without esophagitis: Secondary | ICD-10-CM

## 2018-03-29 DIAGNOSIS — R739 Hyperglycemia, unspecified: Secondary | ICD-10-CM

## 2018-03-29 NOTE — Patient Instructions (Signed)
   Find some alternative drinks for [place], such as sugar-free flavor packs for water, or other diet soda or flavored sparkling water.   Try adding a fruit with lunch for energy and a sweet taste.   Eat generous portions of low-carb veggies with dinner, can also add some with lunch or snacks.

## 2018-03-29 NOTE — Progress Notes (Signed)
Medical Nutrition Therapy: Visit start time: 1700  end time: 1730  Assessment:  Diagnosis: obesity Medical history changes: abdominal pain related to gallbladder, possibly requiring removal per patient Psychosocial issues/ stress concerns: none  Current weight: 409.3lbs Height: 5'10" Medications, supplement changes: reconciled list in medical record  Progress and evaluation: Patient reports weight loss for several weeks, but weight has increased by 10lbs in past 3 weeks; coinciding with patient's return to work and completion of physical therapy. He will be staring a new job in a few weeks that is closer to home and involves more continuous physical activity.   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 Dining out frequency: not assessed today.  Breakfast: sausage egg and cheese croissant Snack: small pkg, chips 100cal Lunch: tuna pkg with rosemary crackers or chips; occasional cheeseburger Snack: none Supper: frozen shrimp, chicken, or pork with starch-- potato, rice, mac and cheese + lima beans, corn, broccoli, or tomatoes, likes greens also (wife does not) Snack: none Beverages: mostly water, coke zero, sometimes pepsi in lunch, occasional sweet tea  Nutrition Care Education: Topics covered: weight control Basic nutrition: appropriate nutrient balance, importance of vegetable and fruit intake  Weight control: importance of portion control, low-sugar beverage options, options for increasing vegetables and fruits, role of physical exercise/ activity-- encouraged including some walking on days off or after work.    Nutritional Diagnosis:  [place]-3.3 Overweight/obesity As related to history of excess calories and inactivity.  As evidenced by patient with current BMI of 58.7, and patient report of dietary and activity history.  Intervention: Instruction and discussion as noted  above.   Updated goals with direction from patient.    Patient declined additional follow-up at this time, but will schedule later if needed.   Education Materials given:  Marland Kitchen Goals/ instructions   Learner/ who was taught:  . Patient    Level of understanding: Marland Kitchen Verbalizes/ demonstrates competency   Demonstrated degree of understanding via:   Teach back Learning barriers: . None  Willingness to learn/ readiness for change: . Eager, change in progress   Monitoring and Evaluation:  Dietary intake, exercise, and body weight      follow up: prn

## 2018-04-02 DIAGNOSIS — M26609 Unspecified temporomandibular joint disorder, unspecified side: Secondary | ICD-10-CM | POA: Diagnosis not present

## 2018-04-02 DIAGNOSIS — R1310 Dysphagia, unspecified: Secondary | ICD-10-CM | POA: Diagnosis not present

## 2018-04-02 DIAGNOSIS — G4733 Obstructive sleep apnea (adult) (pediatric): Secondary | ICD-10-CM | POA: Diagnosis not present

## 2018-04-06 ENCOUNTER — Ambulatory Visit (INDEPENDENT_AMBULATORY_CARE_PROVIDER_SITE_OTHER): Payer: BLUE CROSS/BLUE SHIELD | Admitting: Surgery

## 2018-04-06 ENCOUNTER — Other Ambulatory Visit: Payer: Self-pay

## 2018-04-06 ENCOUNTER — Encounter: Payer: Self-pay | Admitting: Surgery

## 2018-04-06 VITALS — BP 153/84 | HR 89 | Temp 97.7°F | Ht 70.0 in | Wt >= 6400 oz

## 2018-04-06 DIAGNOSIS — K8020 Calculus of gallbladder without cholecystitis without obstruction: Secondary | ICD-10-CM | POA: Diagnosis not present

## 2018-04-06 NOTE — Progress Notes (Signed)
04/06/2018  Reason for Visit:  Symptomatic cholelithiasis  Referring Provider:  Lavon Paganini, MD  History of Present Illness: Jeremy Fraizer is a 27 y.o. male who presents for evaluation of cholelithiasis.  He reports that a few months ago he had an episode of right upper quadrant pain and an ultrasound was done on 01/15/18, but he was otherwise asymptomatic and did not seek surgery evaluation.  Now he has noticed that he's having some more frequent symptoms, about once a month, of right upper quadrant pain and nausea.  The pain radiates to the back.  Reports that fried greasy foods would make the pain worse so he has avoided those which has helped.  He denies any fevers, chills, chest pain, shortness of breath.  His last episode was about 2 weeks ago, and typically the pain lasts for about 3 days.  Sometimes the pain is severe enough that he does not go to work.  Past Medical History: Past Medical History:  Diagnosis Date  . Allergic rhinitis   . Gallstones   . Hypertension      Past Surgical History: Past Surgical History:  Procedure Laterality Date  . NASAL POLYP EXCISION      Home Medications: Prior to Admission medications   Medication Sig Start Date End Date Taking? Authorizing Provider  hydrochlorothiazide (MICROZIDE) 12.5 MG capsule Take 12.5 mg by mouth daily.   Yes [provider]  ranitidine (ZANTAC) 150 MG tablet Take 1 tablet (150 mg total) by mouth 2 (two) times daily. 01/09/18  Yes Bacigalupo, Dionne Bucy, MD    Allergies: Allergies  Allergen Reactions  . Bee Venom Swelling  . Peanut-Containing Drug Products     Has felt throat "closing" after eating peanuts  . Molds & Smuts   . Pollen Extract Other (See Comments)    Sneezing and wheezing    Social History:  reports that he has never smoked. He has never used smokeless tobacco. He reports that he drinks about 1.0 standard drinks of alcohol per week. He reports that he does not use drugs.   Family  History: Family History  Problem Relation Age of Onset  . Diabetes Mother   . Hypertension Father   . Breast cancer Maternal Aunt   . Cancer Maternal Grandmother        unknown cancer type, thought to be related to being a smoker  . Colon cancer Neg Hx   . Prostate cancer Neg Hx     Review of Systems: Review of Systems  Constitutional: Negative for chills and fever.  HENT: Negative for hearing loss.   Respiratory: Negative for shortness of breath.   Cardiovascular: Negative for chest pain.  Gastrointestinal: Positive for abdominal pain and nausea. Negative for constipation, diarrhea and vomiting.  Genitourinary: Negative for dysuria.  Musculoskeletal: Negative for myalgias.  Skin: Negative for rash.  Neurological: Negative for dizziness.  Psychiatric/Behavioral: Negative for depression.    Physical Exam BP (!) 153/84   Pulse 89   Temp 97.7 F (36.5 C) (Skin)   Ht _0  (1.778 m)   Wt (!) 406 lb (184.2 kg)   BMI 58.25 kg/m  CONSTITUTIONAL: No acute distress HEENT:  Normocephalic, atraumatic, extraocular motion intact. NECK: Trachea is midline, and there is no jugular venous distension.  RESPIRATORY:  Lungs are clear, and breath sounds are equal bilaterally. Normal respiratory effort without pathologic use of accessory muscles. CARDIOVASCULAR: Heart is regular without murmurs, gallops, or rubs. GI: The abdomen is soft, morbidly obese, nondistended, currently with  some discomfort over the right upper quadrant.  Negative Murphy's sign. There were no palpable masses.  MUSCULOSKELETAL:  Normal muscle strength and tone in all four extremities.  No peripheral edema or cyanosis. SKIN: Skin turgor is normal. There are no pathologic skin lesions.  NEUROLOGIC:  Motor and sensation is grossly normal.  Cranial nerves are grossly intact. PSYCH:  Alert and oriented to person, place and time. Affect is normal.  Laboratory Analysis: Labs 10/23: Na 141, K 4.3, Cl 106, CO2 21, BUN 8, Cr  0.85.  T bili 1.2, AST 36, ALT 72, Alk phos 19, lipase 17.  Imaging: Ultrasound 03/23/18: IMPRESSION: Technically limited study due to the patient's body habitus. Again demonstrated are gallstones but no sonographic evidence of acute cholecystitis.  Increased hepatic echotexture compatible with fatty infiltrative change. No acute abnormalities are observed elsewhere in the abdomen.  Assessment and Plan: This is a 27 y.o. male with symptomatic cholelithiasis.  I have independently viewed the patient's imaging studies and reviewed the patient's labs.  Overall, ultrasound does show cholelithiasis but no cholecystitis evidence.  His labs are unremarkable except for mild changes to ALT, which could be related to fatty liver disease.  Discussed with the patient that given that despite a low fat diet he still has symptoms, surgery would be appropriate in the form of cholecystectomy.  However, given his BMI, it would be difficult to perform surgery at our facility.  I discussed with my partners and they agree that he would be better benefited at a bariatric center that can perform his cholecystectomy.  For now, given that his symptoms are not too frequent, there is no urgency for surgery and he can follow up on a referral with bariatric surgery.  He lives in [place] and would prefer to go to [facility] for referral.  I did discuss with him that if he were to have worsening pain that is not resolving, nausea, vomiting, fevers, chills, to come to hospital for further evaluation.  Face-to-face time spent with the patient and care providers was 40 minutes, with more than 50% of the time spent counseling, educating, and coordinating care of the patient.     Melvyn Neth, [facility]

## 2018-04-06 NOTE — Patient Instructions (Addendum)
We will refer you to see a bariatric surgeon so you have a consult with them and discuss surgery to remove your gallbaldder. If you do not here from them within a week, please give [place] a call so we could call them.   Cholelithiasis Cholelithiasis is also called "gallstones." It is a kind of gallbladder disease. The gallbladder is an organ that stores a liquid (bile) that helps you digest fat. Gallstones may not cause symptoms (may be silent gallstones) until they cause a blockage, and then they can cause pain (gallbladder attack). Follow these instructions at home:  Take over-the-counter and prescription medicines only as told by your doctor.  Stay at a healthy weight.  Eat healthy foods. This includes: ? Eating fewer fatty foods, like fried foods. ? Eating fewer refined carbs (refined carbohydrates). Refined carbs are breads and grains that are highly processed, like white bread and white rice. Instead, choose whole grains like whole-wheat bread and brown rice. ? Eating more fiber. Almonds, fresh fruit, and beans are healthy sources of fiber.  Keep all follow-up visits as told by your doctor. This is important. Contact a doctor if:  You have sudden pain in the upper right side of your belly (abdomen). Pain might spread to your right shoulder or your chest. This may be a sign of a gallbladder attack.  You feel sick to your stomach (are nauseous).  You throw up (vomit).  You have been diagnosed with gallstones that have no symptoms and you get: ? Belly pain. ? Discomfort, burning, or fullness in the upper part of your belly (indigestion). Get help right away if:  You have sudden pain in the upper right side of your belly, and it lasts for more than 2 hours.  You have belly pain that lasts for more than 5 hours.  You have a fever or chills.  You keep feeling sick to your stomach or you keep throwing up.  Your skin or the whites of your eyes turn yellow (jaundice).  You have  dark-colored pee (urine).  You have light-colored poop (stool). Summary  Cholelithiasis is also called "gallstones."  The gallbladder is an organ that stores a liquid (bile) that helps you digest fat.  Silent gallstones are gallstones that do not cause symptoms.  A gallbladder attack may cause sudden pain in the upper right side of your belly. Pain might spread to your right shoulder or your chest. If this happens, contact your doctor.  If you have sudden pain in the upper right side of your belly that lasts for more than 2 hours, get help right away. This information is not intended to replace advice given to you by your health care provider. Make sure you discuss any questions you have with your health care provider. Document Released: 11/02/2007 Document Revised: 01/31/2016 Document Reviewed: 01/31/2016 Elsevier Interactive Patient Education  2017 [employer].

## 2018-04-09 ENCOUNTER — Telehealth: Payer: Self-pay

## 2018-04-09 NOTE — Telephone Encounter (Signed)
Referral sent to [facility] for the patient. He has their contact information. They will contact the patient to schedule this.   ZO:[phone] Fax: [phone]

## 2018-04-11 ENCOUNTER — Encounter: Payer: Self-pay | Admitting: Family Medicine

## 2018-04-11 ENCOUNTER — Ambulatory Visit (INDEPENDENT_AMBULATORY_CARE_PROVIDER_SITE_OTHER): Payer: BLUE CROSS/BLUE SHIELD | Admitting: Family Medicine

## 2018-04-11 VITALS — BP 139/82 | HR 86 | Temp 98.6°F | Wt >= 6400 oz

## 2018-04-11 DIAGNOSIS — I10 Essential (primary) hypertension: Secondary | ICD-10-CM | POA: Diagnosis not present

## 2018-04-11 DIAGNOSIS — K219 Gastro-esophageal reflux disease without esophagitis: Secondary | ICD-10-CM | POA: Diagnosis not present

## 2018-04-11 DIAGNOSIS — K8020 Calculus of gallbladder without cholecystitis without obstruction: Secondary | ICD-10-CM | POA: Diagnosis not present

## 2018-04-11 MED ORDER — OMEPRAZOLE 20 MG PO CPDR: 20.0000 mg | DELAYED_RELEASE_CAPSULE | Freq: Every day | ORAL | 5 refills | Status: DC

## 2018-04-11 MED ORDER — HYDROCHLOROTHIAZIDE 25 MG PO TABS: 25.0000 mg | ORAL_TABLET | Freq: Every day | ORAL | 3 refills | Status: DC

## 2018-04-11 NOTE — Assessment & Plan Note (Signed)
At goal, but discussed SPRINT trial and will attempt to drive BP closer to 161/09120/80 Increase HCTZ to 25mg  daily Reviewed recent CMP F/u in 3 months

## 2018-04-11 NOTE — Patient Instructions (Signed)
DASH Eating Plan DASH stands for "Dietary Approaches to Stop Hypertension." The DASH eating plan is a healthy eating plan that has been shown to reduce high blood pressure (hypertension). It may also reduce your risk for type 2 diabetes, heart disease, and stroke. The DASH eating plan may also help with weight loss. What are tips for following this plan? General guidelines  Avoid eating more than 2,300 mg (milligrams) of salt (sodium) a day. If you have hypertension, you may need to reduce your sodium intake to 1,500 mg a day.  Limit alcohol intake to no more than 1 drink a day for nonpregnant women and 2 drinks a day for men. One drink equals 12 oz of beer, 5 oz of wine, or 1 oz of hard liquor.  Work with your health care provider to maintain a healthy body weight or to lose weight. Ask what an ideal weight is for you.  Get at least 30 minutes of exercise that causes your heart to beat faster (aerobic exercise) most days of the week. Activities may include walking, swimming, or biking.  Work with your health care provider or diet and nutrition specialist (dietitian) to adjust your eating plan to your individual calorie needs. Reading food labels  Check food labels for the amount of sodium per serving. Choose foods with less than 5 percent of the Daily Value of sodium. Generally, foods with less than 300 mg of sodium per serving fit into this eating plan.  To find whole grains, look for the word "whole" as the first word in the ingredient list. Shopping  Buy products labeled as "low-sodium" or "no salt added."  Buy fresh foods. Avoid canned foods and premade or frozen meals. Cooking  Avoid adding salt when cooking. Use salt-free seasonings or herbs instead of table salt or sea salt. Check with your health care provider or pharmacist before using salt substitutes.  Do not fry foods. Cook foods using healthy methods such as baking, boiling, grilling, and broiling instead.  Cook with  heart-healthy oils, such as olive, canola, soybean, or sunflower oil. Meal planning   Eat a balanced diet that includes: ? 5 or more servings of fruits and vegetables each day. At each meal, try to fill half of your plate with fruits and vegetables. ? Up to 6-8 servings of whole grains each day. ? Less than 6 oz of lean meat, poultry, or fish each day. A 3-oz serving of meat is about the same size as a deck of cards. One egg equals 1 oz. ? 2 servings of low-fat dairy each day. ? A serving of nuts, seeds, or beans 5 times each week. ? Heart-healthy fats. Healthy fats called Omega-3 fatty acids are found in foods such as flaxseeds and coldwater fish, like sardines, salmon, and mackerel.  Limit how much you eat of the following: ? Canned or prepackaged foods. ? Food that is high in trans fat, such as fried foods. ? Food that is high in saturated fat, such as fatty meat. ? Sweets, desserts, sugary drinks, and other foods with added sugar. ? Full-fat dairy products.  Do not salt foods before eating.  Try to eat at least 2 vegetarian meals each week.  Eat more home-cooked food and less restaurant, buffet, and fast food.  When eating at a restaurant, ask that your food be prepared with less salt or no salt, if possible. What foods are recommended? The items listed may not be a complete list. Talk with your dietitian about what   dietary choices are best for you. Grains Whole-grain or whole-wheat bread. Whole-grain or whole-wheat pasta. Brown rice. Oatmeal. Quinoa. Bulgur. Whole-grain and low-sodium cereals. Pita bread. Low-fat, low-sodium crackers. Whole-wheat flour tortillas. Vegetables Fresh or frozen vegetables (raw, steamed, roasted, or grilled). Low-sodium or reduced-sodium tomato and vegetable juice. Low-sodium or reduced-sodium tomato sauce and tomato paste. Low-sodium or reduced-sodium canned vegetables. Fruits All fresh, dried, or frozen fruit. Canned fruit in natural juice (without  added sugar). Meat and other protein foods Skinless chicken or turkey. Ground chicken or turkey. Pork with fat trimmed off. Fish and seafood. Egg whites. Dried beans, peas, or lentils. Unsalted nuts, nut butters, and seeds. Unsalted canned beans. Lean cuts of beef with fat trimmed off. Low-sodium, lean deli meat. Dairy Low-fat (1%) or fat-free (skim) milk. Fat-free, low-fat, or reduced-fat cheeses. Nonfat, low-sodium ricotta or cottage cheese. Low-fat or nonfat yogurt. Low-fat, low-sodium cheese. Fats and oils Soft margarine without trans fats. Vegetable oil. Low-fat, reduced-fat, or light mayonnaise and salad dressings (reduced-sodium). Canola, safflower, olive, soybean, and sunflower oils. Avocado. Seasoning and other foods Herbs. Spices. Seasoning mixes without salt. Unsalted popcorn and pretzels. Fat-free sweets. What foods are not recommended? The items listed may not be a complete list. Talk with your dietitian about what dietary choices are best for you. Grains Baked goods made with fat, such as croissants, muffins, or some breads. Dry pasta or rice meal packs. Vegetables Creamed or fried vegetables. Vegetables in a cheese sauce. Regular canned vegetables (not low-sodium or reduced-sodium). Regular canned tomato sauce and paste (not low-sodium or reduced-sodium). Regular tomato and vegetable juice (not low-sodium or reduced-sodium). Pickles. Olives. Fruits Canned fruit in a light or heavy syrup. Fried fruit. Fruit in cream or butter sauce. Meat and other protein foods Fatty cuts of meat. Ribs. Fried meat. Bacon. Sausage. Bologna and other processed lunch meats. Salami. Fatback. Hotdogs. Bratwurst. Salted nuts and seeds. Canned beans with added salt. Canned or smoked fish. Whole eggs or egg yolks. Chicken or turkey with skin. Dairy Whole or 2% milk, cream, and half-and-half. Whole or full-fat cream cheese. Whole-fat or sweetened yogurt. Full-fat cheese. Nondairy creamers. Whipped toppings.  Processed cheese and cheese spreads. Fats and oils Butter. Stick margarine. Lard. Shortening. Ghee. Bacon fat. Tropical oils, such as coconut, palm kernel, or palm oil. Seasoning and other foods Salted popcorn and pretzels. Onion salt, garlic salt, seasoned salt, table salt, and sea salt. Worcestershire sauce. Tartar sauce. Barbecue sauce. Teriyaki sauce. Soy sauce, including reduced-sodium. Steak sauce. Canned and packaged gravies. Fish sauce. Oyster sauce. Cocktail sauce. Horseradish that you find on the shelf. Ketchup. Mustard. Meat flavorings and tenderizers. Bouillon cubes. Hot sauce and Tabasco sauce. Premade or packaged marinades. Premade or packaged taco seasonings. Relishes. Regular salad dressings. Where to find more information:  National Heart, Lung, and Blood Institute: www.nhlbi.nih.gov  American Heart Association: www.heart.org Summary  The DASH eating plan is a healthy eating plan that has been shown to reduce high blood pressure (hypertension). It may also reduce your risk for type 2 diabetes, heart disease, and stroke.  With the DASH eating plan, you should limit salt (sodium) intake to 2,300 mg a day. If you have hypertension, you may need to reduce your sodium intake to 1,500 mg a day.  When on the DASH eating plan, aim to eat more fresh fruits and vegetables, whole grains, lean proteins, low-fat dairy, and heart-healthy fats.  Work with your health care provider or diet and nutrition specialist (dietitian) to adjust your eating plan to your individual   calorie needs. This information is not intended to replace advice given to you by your health care provider. Make sure you discuss any questions you have with your health care provider. Document Released: 05/05/2011 Document Revised: 05/09/2016 Document Reviewed: 05/09/2016 Elsevier Interactive Patient Education  2018 [employer]  

## 2018-04-11 NOTE — Progress Notes (Signed)
Patient: Jeremy David Male    DOB: [birth year]   27 y.o.   MRN: [id number] Visit Date: 04/11/2018  Today's Provider: Shirlee Latch, MD   Chief Complaint  Patient presents with  . Hypertension  . Gastroesophageal Reflux   Subjective:    HPI  Hypertension, follow-up:  BP Readings from Last 3 Encounters:  04/11/18 139/82  04/06/18 (!) 153/84  03/21/18 (!) 142/88   He was last seen for hypertension 3 months ago.  BP at that visit was 138/92. Management since that visit includes continue HCTZ 12.5 mg and monitor BP at home. He reports fair compliance with treatment.  He is not having side effects.  He is not exercising. He is not adherent to low salt diet.   Outside blood pressures are being checked, but patient states his monitor is not accurate He is experiencing none   Patient denies chest pressure/discomfort, claudication, exertional chest pressure/discomfort, fatigue, irregular heart beat, lower extremity edema, near-syncope, orthopnea, palpitations and syncope.   Cardiovascular risk factors include hypertension, male gender and obesity (BMI >= 30 kg/m2).  Use of agents associated with hypertension: none.                Weight trend: stable  Wt Readings from Last 3 Encounters:  04/11/18 (!) 410 lb (186 kg)  04/06/18 (!) 406 lb (184.2 kg)  03/29/18 (!) 409 lb 4.8 oz (185.7 kg)    Current diet: is avoiding pork. Is trying to eat more chicken, fish and [place] ------------------------------------------------------------------------  GERD, Follow up:  The patient was last seen for GERD 3 months ago. Changes made since that visit include continue Zantac 150 mg.  He reports poor compliance with treatment.Patient states he has not taken Zantac the past month He is not having side effects. Marland Kitchen  He IS experiencing choking on food. He is NOT experiencing any other symptoms at this time.   Allergies  Allergen Reactions  . Bee Venom Swelling  .  Peanut-Containing Drug Products     Has felt throat "closing" after eating peanuts  . Molds & Smuts   . Pollen Extract Other (See Comments)    Sneezing and wheezing     Current Outpatient Medications:  .  hydrochlorothiazide (MICROZIDE) 12.5 MG capsule, Take 12.5 mg by mouth daily., Disp: , Rfl:  .  ranitidine (ZANTAC) 150 MG tablet, Take 1 tablet (150 mg total) by mouth 2 (two) times daily., Disp: 180 tablet, Rfl: 3 .  traMADol (ULTRAM) 50 MG tablet, Take by mouth every 6 (six) hours as needed., Disp: , Rfl:   Review of Systems  Constitutional: Negative.   Respiratory: Negative.   Cardiovascular: Negative.   Gastrointestinal: Negative.   Musculoskeletal: Negative.     Social History   Tobacco Use  . Smoking status: Never Smoker  . Smokeless tobacco: Never Used  Substance Use Topics  . Alcohol use: Yes    Alcohol/week: 1.0 standard drinks    Types: 1 Standard drinks or equivalent per week   Objective:   BP 139/82 (BP Location: Left Arm, Patient Position: Sitting, Cuff Size: Large)   Pulse 86   Temp 98.6 F (37 C) (Oral)   Wt (!) 410 lb (186 kg)   SpO2 96%   BMI 58.83 kg/m  Vitals:   04/11/18 0804  BP: 139/82  Pulse: 86  Temp: 98.6 F (37 C)  TempSrc: Oral  SpO2: 96%  Weight: (!) 410 lb (186 kg)     Physical Exam  Constitutional:  He is oriented to person, place, and time. He appears well-developed and well-nourished. No distress.  HENT:  Head: Normocephalic and atraumatic.  Mouth/Throat: Oropharynx is clear and moist.  Eyes: Conjunctivae are normal. No scleral icterus.  Neck: Neck supple. No thyromegaly present.  Cardiovascular: Normal rate, regular rhythm, normal heart sounds and intact distal pulses.  No murmur heard. Pulmonary/Chest: Effort normal and breath sounds normal. No respiratory distress. He has no wheezes. He has no rales.  Musculoskeletal: He exhibits no edema.  Lymphadenopathy:    He has no cervical adenopathy.  Neurological: He is alert  and oriented to person, place, and time.  Skin: Skin is warm and dry. Capillary refill takes less than 2 seconds. No rash noted.  Psychiatric: He has a normal mood and affect. His behavior is normal.  Vitals reviewed.       Assessment & Plan:   Problem List Items Addressed This Visit      Cardiovascular and Mediastinum   Hypertension - Primary    At goal, but discussed SPRINT trial and will attempt to drive BP closer to 782/95120/80 Increase HCTZ to 25mg  daily Reviewed recent CMP F/u in 3 months      Relevant Medications   hydrochlorothiazide (HYDRODIURIL) 25 MG tablet     Digestive   Gallstones    Patient with symptomatic cholelithiasis When he saw surgery, [facility] felt [facility] needed [facility] center for cholecystectomy [facility] We will send a note from previous surgery visit to see if they would be willing to do this      Relevant Orders   Ambulatory referral to General Surgery   GERD (gastroesophageal reflux disease)    Uncontrolled States that H2-blocker was not very helpful and he is not taking this anymore We will try Prilosec      Relevant Medications   omeprazole (PRILOSEC) 20 MG capsule     Other   Morbid obesity (HCC)    Patient has seen nutrition He has been hesitant about bariatric surgery It could be that when he sees surgery about his gallbladder, that this can be discussed as well          Return in about 3 months (around 07/12/2018) for BP f/u.   The entirety of the information documented in the History of Present Illness, Review of Systems and Physical Exam were personally obtained by me. Portions of this information were initially documented by Presley RaddleNikki Walston, CMA and reviewed by me for thoroughness and accuracy.    Erasmo DownerBacigalupo, Angela M, MD, MPH [facility] 04/11/2018 8:39 AM

## 2018-04-11 NOTE — Assessment & Plan Note (Signed)
Patient has seen nutrition He has been hesitant about bariatric surgery It could be that when he sees surgery about his gallbladder, that this can be discussed as well

## 2018-04-11 NOTE — Assessment & Plan Note (Signed)
Patient with symptomatic cholelithiasis When he saw surgery, [facility] felt he needed a [facility]ric center for cholecystectomy He would [facility] We will send a note from previous surgery visit to see if they would be willing to do this

## 2018-04-11 NOTE — Assessment & Plan Note (Signed)
Uncontrolled States that H2-blocker was not very helpful and he is not taking this anymore We will try Prilosec

## 2018-04-13 ENCOUNTER — Telehealth: Payer: Self-pay

## 2018-04-13 NOTE — Telephone Encounter (Signed)
B[facility] called to speak with Carl-lyn in regards to the patients referral for bariatric surgery she gave left the office number for the patient to be scheduled. [phone]

## 2018-04-19 ENCOUNTER — Telehealth: Payer: Self-pay | Admitting: *Deleted

## 2018-04-19 NOTE — Telephone Encounter (Signed)
Darryl from [facility] (Phone: [phone]) called and stated that he spoke with the patient this morning in regards to an appointment.   The patient told him that [facility] because he is going to be seen somewhere closer to home.

## 2018-05-01 ENCOUNTER — Ambulatory Visit: Payer: BLUE CROSS/BLUE SHIELD | Admitting: Gastroenterology

## 2018-05-02 DIAGNOSIS — G4733 Obstructive sleep apnea (adult) (pediatric): Secondary | ICD-10-CM | POA: Diagnosis not present

## 2018-05-14 DIAGNOSIS — K8020 Calculus of gallbladder without cholecystitis without obstruction: Secondary | ICD-10-CM | POA: Diagnosis not present

## 2018-05-16 DIAGNOSIS — M50321 Other cervical disc degeneration at C4-C5 level: Secondary | ICD-10-CM | POA: Diagnosis not present

## 2018-05-16 DIAGNOSIS — M47812 Spondylosis without myelopathy or radiculopathy, cervical region: Secondary | ICD-10-CM | POA: Diagnosis not present

## 2018-06-02 DIAGNOSIS — G4733 Obstructive sleep apnea (adult) (pediatric): Secondary | ICD-10-CM | POA: Diagnosis not present

## 2018-07-03 DIAGNOSIS — G4733 Obstructive sleep apnea (adult) (pediatric): Secondary | ICD-10-CM | POA: Diagnosis not present

## 2018-07-16 ENCOUNTER — Encounter: Payer: Self-pay | Admitting: Family Medicine

## 2018-07-16 ENCOUNTER — Ambulatory Visit (INDEPENDENT_AMBULATORY_CARE_PROVIDER_SITE_OTHER): Payer: BLUE CROSS/BLUE SHIELD | Admitting: Family Medicine

## 2018-07-16 VITALS — BP 143/84 | HR 81 | Temp 98.2°F | Wt >= 6400 oz

## 2018-07-16 DIAGNOSIS — R7303 Prediabetes: Secondary | ICD-10-CM | POA: Diagnosis not present

## 2018-07-16 DIAGNOSIS — K219 Gastro-esophageal reflux disease without esophagitis: Secondary | ICD-10-CM | POA: Diagnosis not present

## 2018-07-16 DIAGNOSIS — I10 Essential (primary) hypertension: Secondary | ICD-10-CM

## 2018-07-16 NOTE — Assessment & Plan Note (Signed)
Uncontrolled Not taking medication regularly - discussed importance and strategies Continue HCTZ 25mg  daily Recheck BMP F/u in 3 months

## 2018-07-16 NOTE — Assessment & Plan Note (Signed)
Has lost some weight - congratulated on this Discussed diet and exercise He has been hesitant about bariatric surgery

## 2018-07-16 NOTE — Patient Instructions (Signed)
Look into strategies to take medicine regularly - phone alarm, having it by your toothbrush or on nightstand  Call GI to reschedule appointment

## 2018-07-16 NOTE — Assessment & Plan Note (Signed)
Uncontrolled States that prilosec helps, but not remembering to take regularly Will be rescheduling appt with GI

## 2018-07-16 NOTE — Progress Notes (Signed)
Patient: Jeremy David Male    DOB: 1990/06/29   28 y.o.   MRN: [id number] Visit Date: 07/16/2018  Today's Provider: Shirlee Latch, MD   Chief Complaint  Patient presents with  . Hypertension  . Gastroesophageal Reflux   Subjective:    I, Jeremy David, CMA, am acting as a scribe for Jeremy Latch, MD.    HPI  Hypertension, follow-up:     BP Readings from Last 3 Encounters:  04/11/18 139/82  04/06/18 (!) 153/84  03/21/18 (!) 142/88   Hewas last seen for hypertension .  BP at that visit was139/82. Management since that visit includesincrease HCTZ to 25 mg and monitor BP at home. Hereports faircompliance with treatment. His last dose of medication was 4 days ago. Heis nothaving side effects.  Heis not exercising regularly, but stays active at work. He reports walking 15,000-20,000 steps per day- 4 days per week. Heis notadherent to low salt diet.  Outside blood pressures arebeing checked periodically,  but patient states his monitor is not accurate Heis experiencing none Patient denieschest pressure/discomfort, claudication, exertional chest pressure/discomfort, fatigue, irregular heart beat, lower extremity edema, near-syncope, orthopnea, palpitations and syncope.  Cardiovascular risk factors includehypertension, male gender and obesity (BMI >= 30 kg/m2).  Use of agents associated with hypertension:none.    Weight trend:stable     Wt Readings from Last 3 Encounters:  04/11/18 (!) 410 lb (186 kg)  04/06/18 (!) 406 lb (184.2 kg)  03/29/18 (!) 409 lb 4.8 oz (185.7 kg)   Current diet:is avoiding pork. Is trying to eat more chicken, fish and [place] ------------------------------------------------------------------------  GERD, Follow up:  The patient was last seen for GERD35monthsago. Changes made since that visit includestarting Prilosec 20 mg.  Hereports that it is helpful when he can remember to take  it He has been referred to GI by ENT.  He needs to reschedule the appt. Heis nothaving side effects. Marland Kitchen  HeIS experiencing choking on food at times, and a "air packet" in the back of his throat. Heis NOT experiencing any other symptoms at this time.  Allergies  Allergen Reactions  . Bee Venom Swelling  . Peanut-Containing Drug Products     Has felt throat "closing" after eating peanuts  . Molds & Smuts   . Pollen Extract Other (See Comments)    Sneezing and wheezing     Current Outpatient Medications:  .  hydrochlorothiazide (HYDRODIURIL) 25 MG tablet, Take 1 tablet (25 mg total) by mouth daily., Disp: 90 tablet, Rfl: 3 .  omeprazole (PRILOSEC) 20 MG capsule, Take 1 capsule (20 mg total) by mouth daily., Disp: 30 capsule, Rfl: 5 .  traMADol (ULTRAM) 50 MG tablet, Take by mouth every 6 (six) hours as needed., Disp: , Rfl:   Review of Systems  Constitutional: Negative.   Respiratory: Negative.   Cardiovascular: Negative.   Gastrointestinal: Negative.   Musculoskeletal: Negative.     Social History   Tobacco Use  . Smoking status: Never Smoker  . Smokeless tobacco: Never Used  Substance Use Topics  . Alcohol use: Yes    Alcohol/week: 1.0 standard drinks    Types: 1 Standard drinks or equivalent per week      Objective:   BP (!) 143/84 (BP Location: Right Arm, Patient Position: Sitting, Cuff Size: Large)   Pulse 81   Temp 98.2 F (36.8 C) (Oral)   Wt (!) 400 lb 8 oz (181.7 kg)   SpO2 96%   BMI 57.47 kg/m  Vitals:  07/16/18 0811  BP: (!) 143/84  Pulse: 81  Temp: 98.2 F (36.8 C)  TempSrc: Oral  SpO2: 96%  Weight: (!) 400 lb 8 oz (181.7 kg)     Physical Exam Vitals signs reviewed.  Constitutional:      General: He is not in acute distress.    Appearance: He is obese. He is not diaphoretic.  HENT:     Head: Normocephalic and atraumatic.  Eyes:     General: No scleral icterus.    Conjunctiva/sclera: Conjunctivae normal.  Neck:      Musculoskeletal: Neck supple.  Cardiovascular:     Rate and Rhythm: Normal rate and regular rhythm.     Pulses: Normal pulses.     Heart sounds: Normal heart sounds. No murmur.  Pulmonary:     Effort: Pulmonary effort is normal. No respiratory distress.     Breath sounds: Normal breath sounds. No wheezing or rhonchi.  Abdominal:     General: There is no distension.     Palpations: Abdomen is soft.     Tenderness: There is no abdominal tenderness.  Musculoskeletal:     Right lower leg: No edema.     Left lower leg: No edema.  Lymphadenopathy:     Cervical: No cervical adenopathy.  Skin:    General: Skin is warm and dry.     Capillary Refill: Capillary refill takes less than 2 seconds.     Findings: No rash.  Neurological:     Mental Status: He is alert and oriented to person, place, and time. Mental status is at baseline.  Psychiatric:        Mood and Affect: Mood normal.        Behavior: Behavior normal.         Assessment & Plan   Problem List Items Addressed This Visit      Cardiovascular and Mediastinum   Hypertension - Primary    Uncontrolled Not taking medication regularly - discussed importance and strategies Continue HCTZ 25mg  daily Recheck BMP F/u in 3 months      Relevant Orders   Basic Metabolic Panel (BMET)     Digestive   GERD (gastroesophageal reflux disease)    Uncontrolled States that prilosec helps, but not remembering to take regularly Will be rescheduling appt with GI        Other   Prediabetes    Discussed low carb diet Recheck A1c      Relevant Orders   Hemoglobin A1c   Morbid obesity (HCC)    Has lost some weight - congratulated on this Discussed diet and exercise He has been hesitant about bariatric surgery          Return in about 3 months (around 10/14/2018) for chronic disease f/u.   The entirety of the information documented in the History of Present Illness, Review of Systems and Physical Exam were personally  obtained by me. Portions of this information were initially documented by Jeremy David, CMA and reviewed by me for thoroughness and accuracy.    Jeremy Downer, MD, MPH [facility] 07/16/2018 8:34 AM

## 2018-07-16 NOTE — Assessment & Plan Note (Signed)
Discussed low-carb diet Recheck A1c 

## 2018-07-17 LAB — HEMOGLOBIN A1C
ESTIMATED AVERAGE GLUCOSE: 117 mg/dL
HEMOGLOBIN A1C: 5.7 % — AB (ref 4.8–5.6)

## 2018-07-17 LAB — BASIC METABOLIC PANEL
BUN/Creatinine Ratio: 14 (ref 9–20)
BUN: 11 mg/dL (ref 6–20)
CO2: 20 mmol/L (ref 20–29)
Calcium: 8.9 mg/dL (ref 8.7–10.2)
Chloride: 103 mmol/L (ref 96–106)
Creatinine, Ser: 0.81 mg/dL (ref 0.76–1.27)
GFR calc non Af Amer: 120 mL/min/{1.73_m2} (ref >59)
GFR, EST AFRICAN AMERICAN: 139 mL/min/{1.73_m2} (ref >59)
GLUCOSE: 106 mg/dL — AB (ref 65–99)
POTASSIUM: 4.3 mmol/L (ref 3.5–5.2)
Sodium: 140 mmol/L (ref 134–144)

## 2018-09-20 IMAGING — US US ABDOMEN LIMITED
1 series · 14 of 25 positions shown · non-contrast
Comparison: None.

CLINICAL DATA: Cholelithiasis, right upper quadrant tenderness and
swelling

EXAM:
ULTRASOUND ABDOMEN LIMITED RIGHT UPPER QUADRANT

[Series 1: us abdomen limited · 0.33mm/px · 14 of 50 slices shown]
[im 1/50]
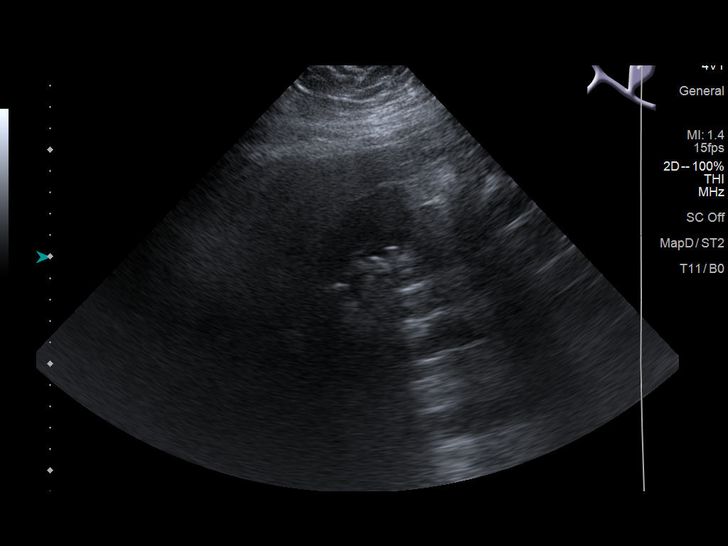
[im 5/50]
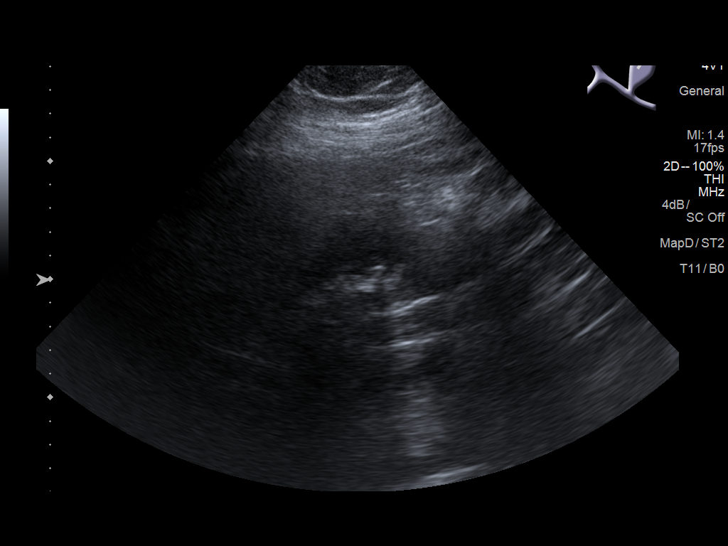
[im 9/50]
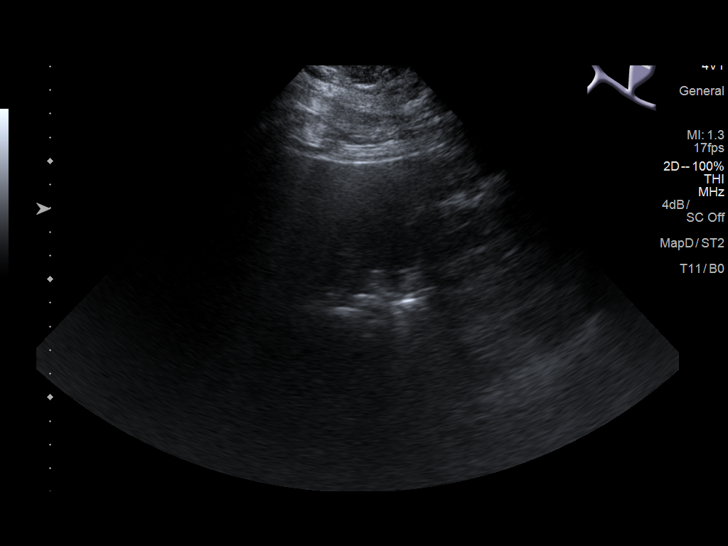
[im 13/50]
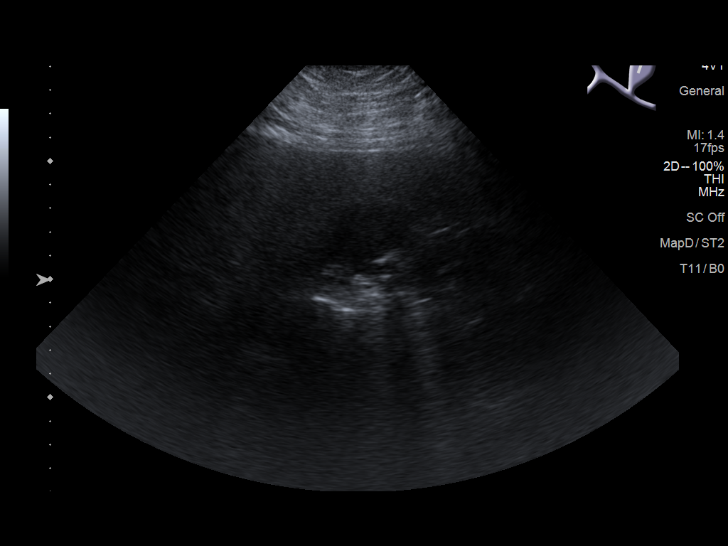
[im 17/50]
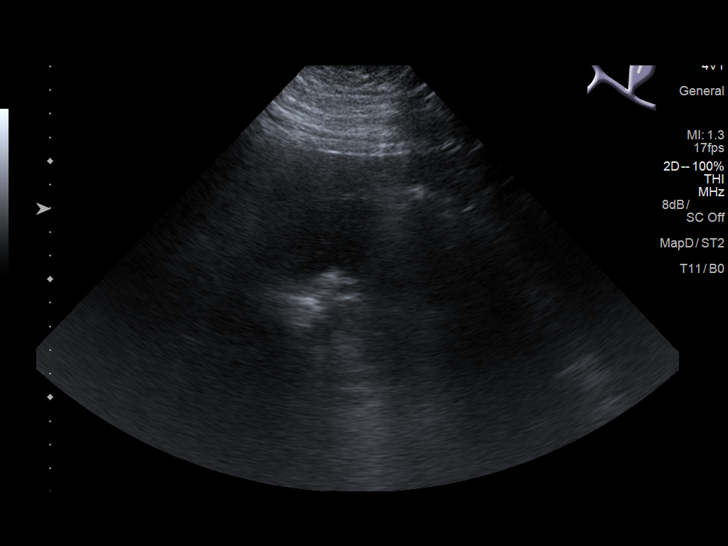
[im 19/50]
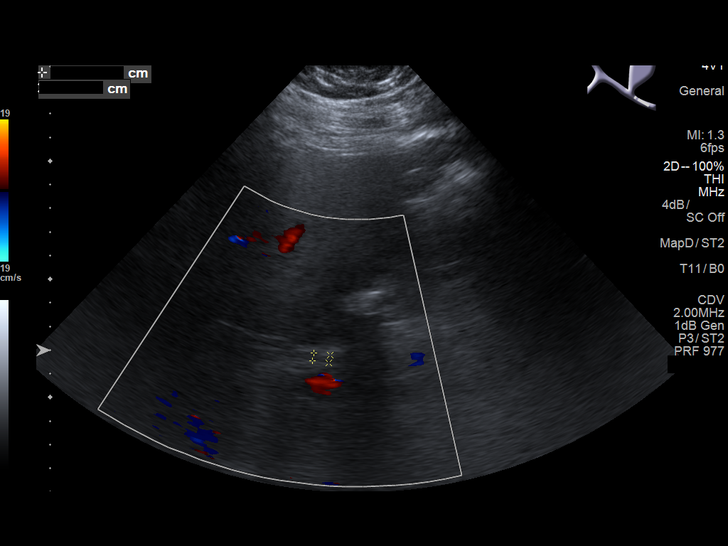
[im 23/50]
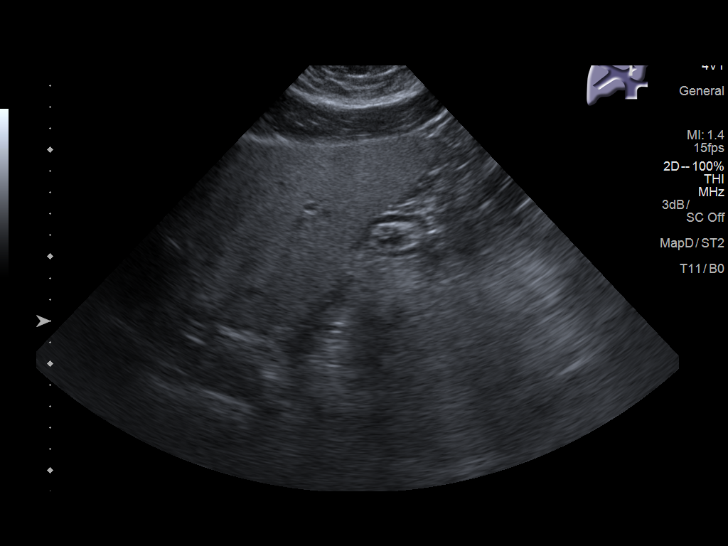
[im 27/50]
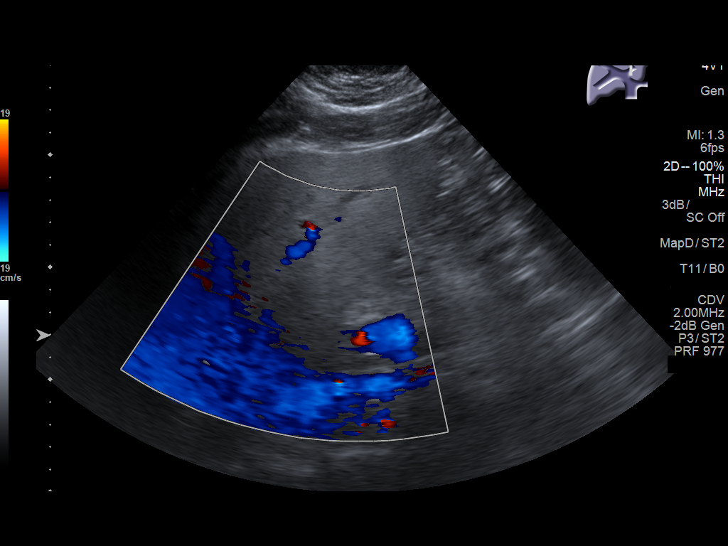
[im 31/50]
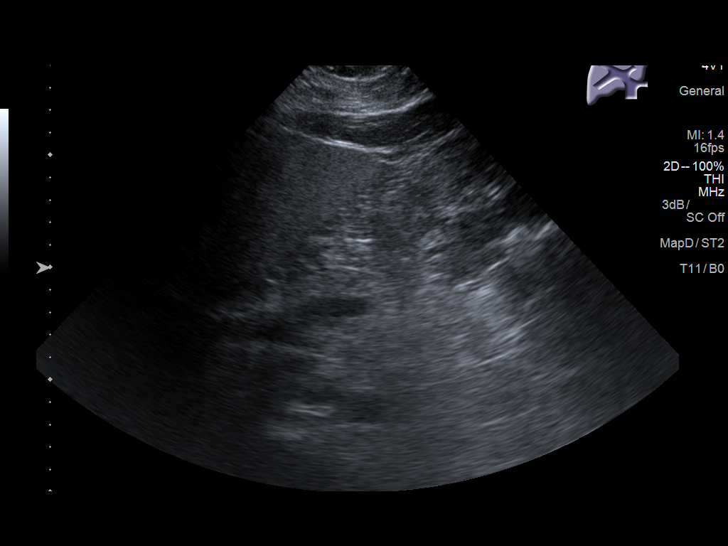
[im 33/50]
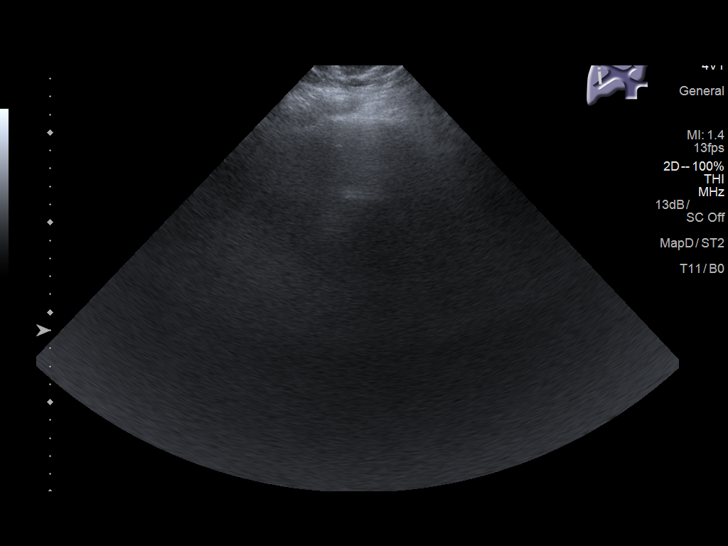
[im 37/50]
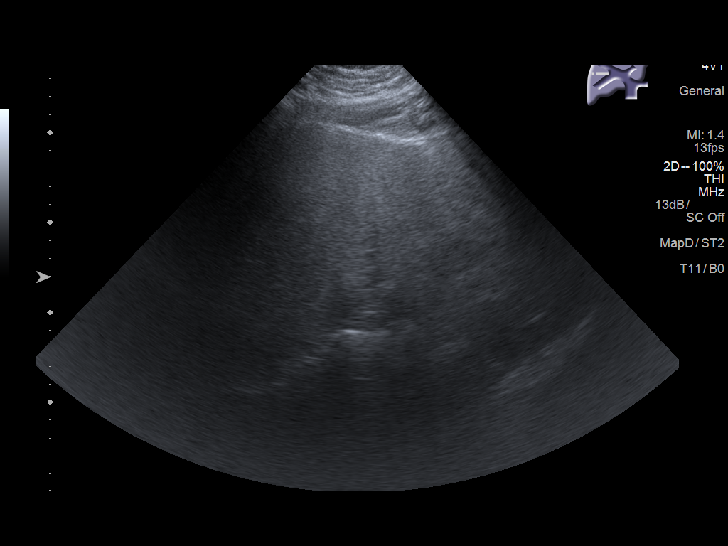
[im 41/50]
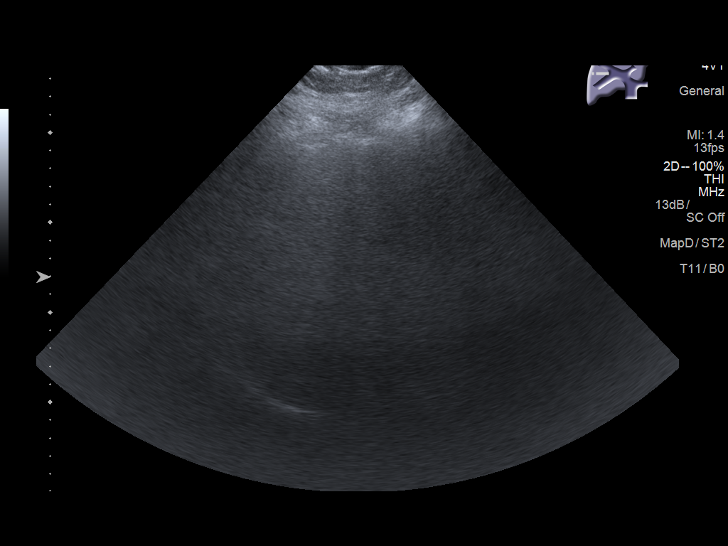
[im 45/50]
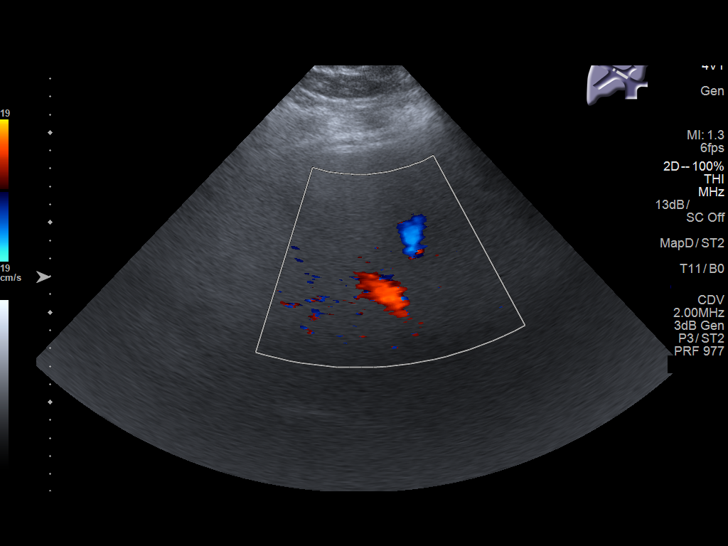
[im 50/50]
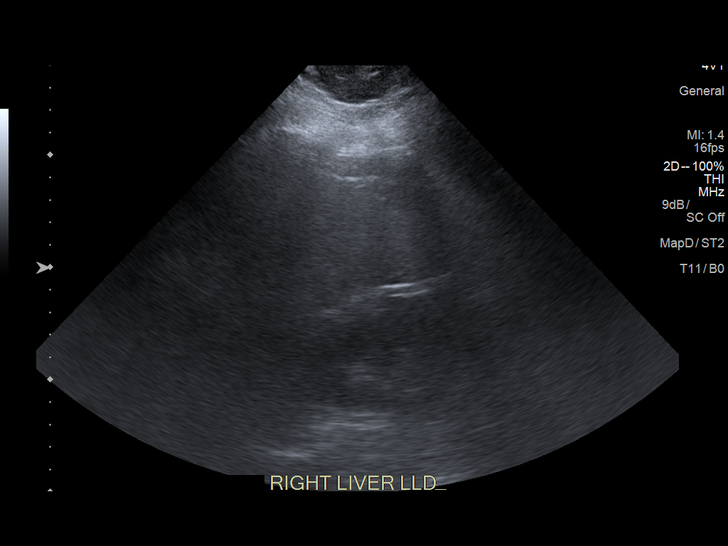

[14 of 25 positions shown; findings below may reference images not displayed]

FINDINGS: Gallbladder:

Echogenic shadowing gallstones layered within the gallbladder
dependently, largest stone measures 9 mm by ultrasound. Wall
thickness is normal measuring 2.7 mm. No Murphy's sign elicited or
pericholecystic fluid.

Common bile duct:

Diameter: 3.4 mm

Liver:

There is increased hepatic echogenicity compatible with hepatic
steatosis or fatty infiltration. This limits evaluation for focal
abnormality. Liver exam is also limited because of body habitus. No
intrahepatic biliary dilatation. No surrounding ascites.

Portal vein is patent on color Doppler imaging with normal direction
of blood flow towards the liver.
IMPRESSION: Cholelithiasis without other acute finding by ultrasound

Hepatic steatosis

Limited exam as above.

## 2018-10-16 ENCOUNTER — Ambulatory Visit: Payer: BLUE CROSS/BLUE SHIELD | Admitting: Family Medicine

## 2018-10-23 DIAGNOSIS — G4733 Obstructive sleep apnea (adult) (pediatric): Secondary | ICD-10-CM | POA: Diagnosis not present

## 2018-11-08 IMAGING — RF DG FLUORO GUIDE NDL PLC/BX
1 series · 1 of 1 positions shown · non-contrast
Comparison: none

CLINICAL DATA: Right shoulder pain for 6 months

[Series 1: cp_standard · 0.27mm/px · 1 of 1 slices shown]
[im 1/1]
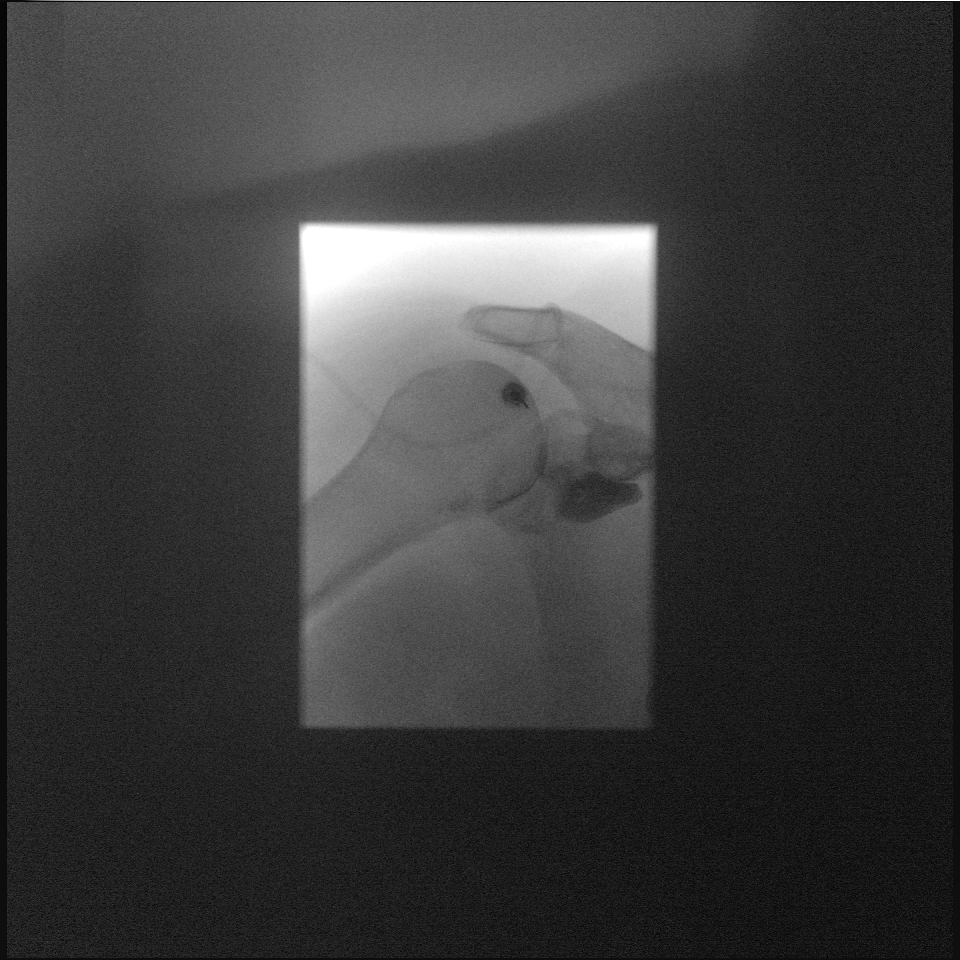

[1 of 1 positions shown; findings below may reference images not displayed]

EXAM:
RIGHT SHOULDER INJECTION UNDER FLUOROSCOPY

FLUOROSCOPY TIME:  Fluoroscopy Time:  0.6 minutes

Radiation Exposure Index (if provided by the fluoroscopic device):
7.9 mGy

Number of Acquired Spot Images: 0

PROCEDURE:
The risks and benefits of the procedure were discussed with the
patient, and written informed consent was obtained. The patient
stated no history of allergy to contrast media. A formal timeout
procedure was performed with the patient according to departmental
protocol.

The patient was placed supine on the fluoroscopy table and the right
glenohumeral joint was identified under fluoroscopy. The skin
overlying the right glenohumeral joint was subsequently cleaned with
Chloraprep and a sterile drape was placed over the area of interest.
5 ml 1% Lidocaine was used to anesthetize the skin around the needle
insertion site.

A 22 gauge spinal needle was inserted into the right glenohumeral
joint under fluoroscopy. Position was confirmed with injection of
less than 1ml of Isovue 200 under fluoroscopy.

12 ml of gadolinium mixture (0.1 ml of Multihance mixed with 20 ml
of sterile saline) was injected into the right glenohumeral joint.

The needle was removed and hemostasis was achieved. The patient was
subsequently transferred to MRI for imaging.
IMPRESSION: Successful right shoulder arthrogram prior to MRI.

## 2018-11-26 IMAGING — US US ABDOMEN COMPLETE
1 series · 13 of 25 positions shown · non-contrast
Comparison: Right upper quadrant abdominal ultrasound January 15, 2018

CLINICAL DATA: One week history of epigastric pain.  Obesity.

EXAM:
ABDOMEN ULTRASOUND COMPLETE

[Series 1: us abdomen complete · 0.36mm/px · 13 of 94 slices shown]
[im 1/94]
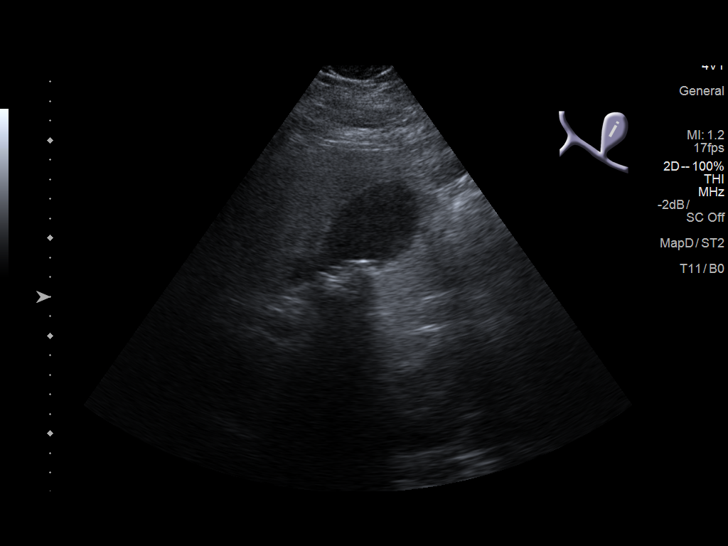
[im 8/94]
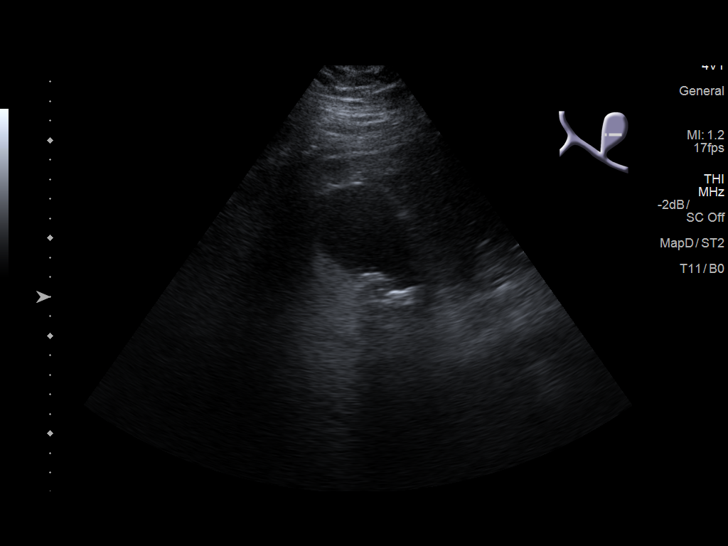
[im 16/94]
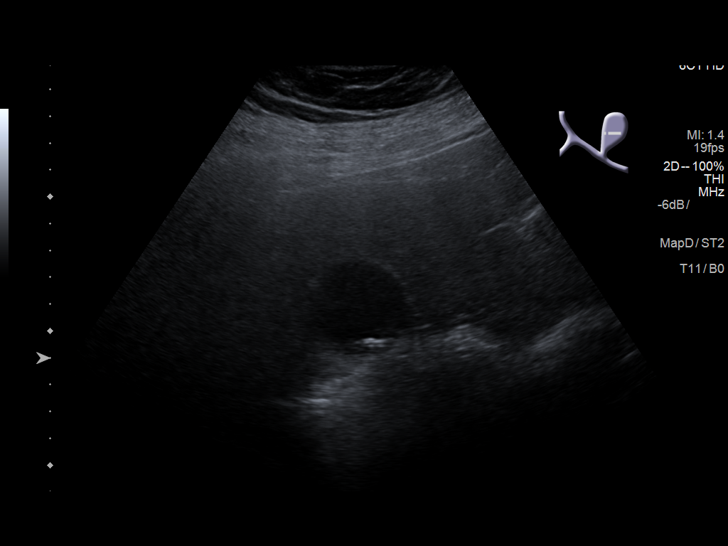
[im 24/94]
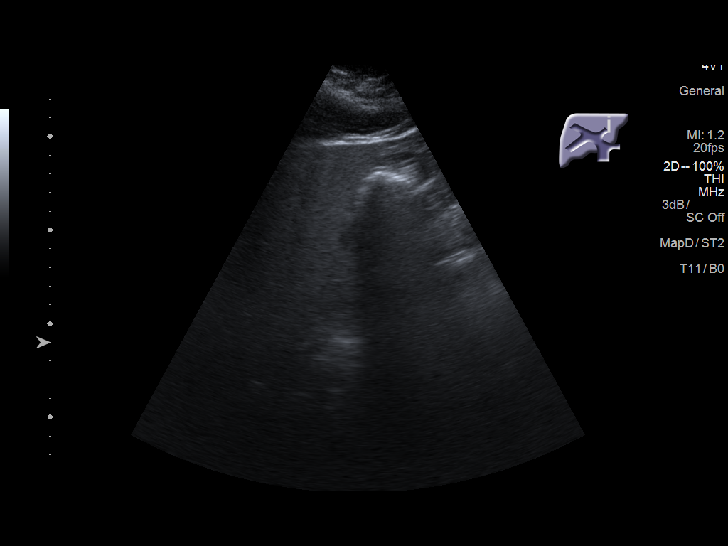
[im 32/94]
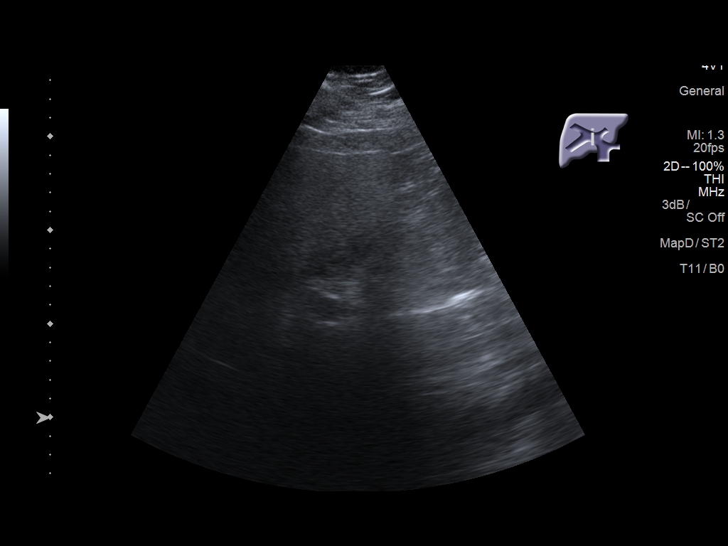
[im 39/94]
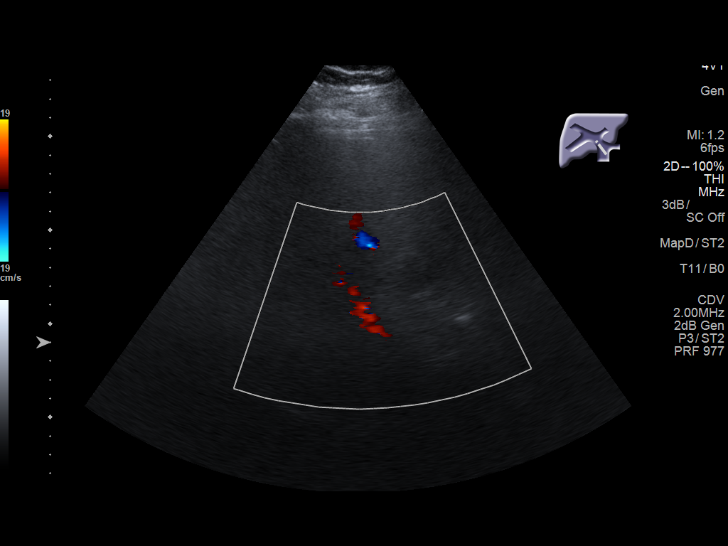
[im 47/94]
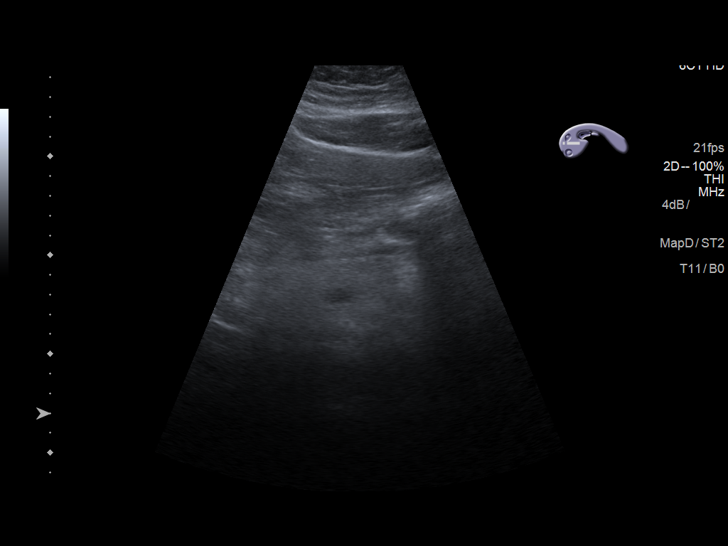
[im 55/94]
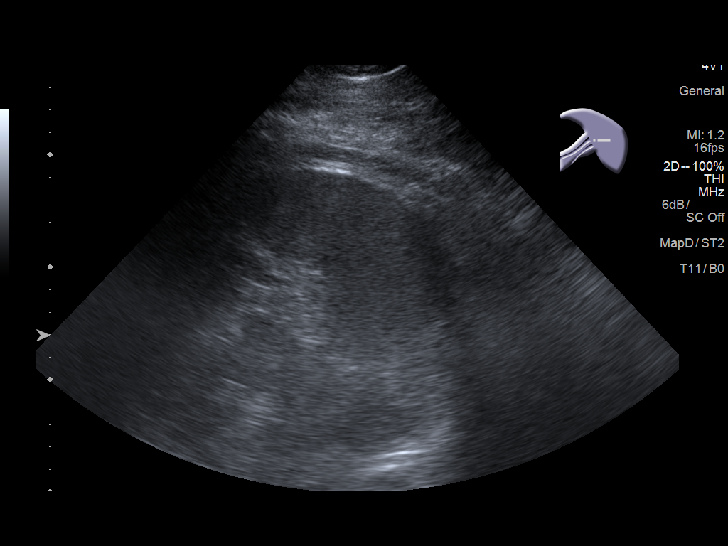
[im 63/94]
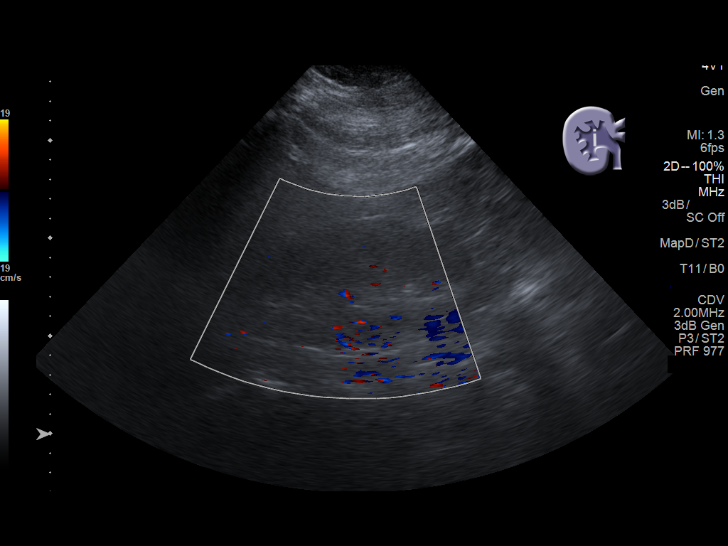
[im 70/94]
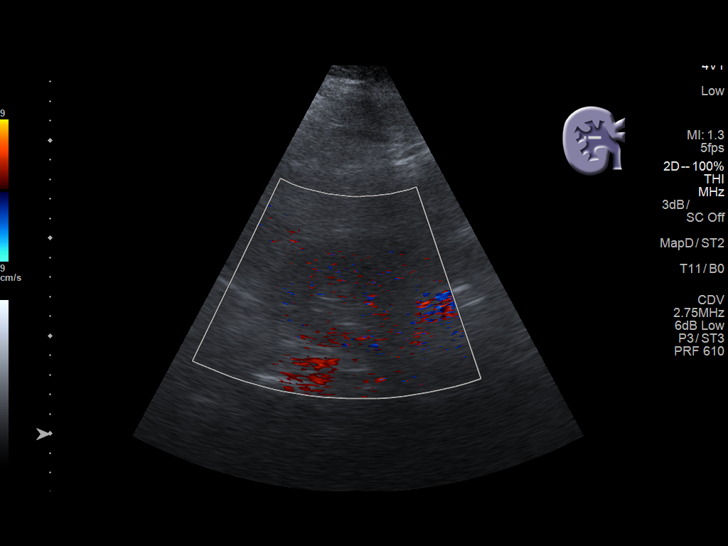
[im 78/94]
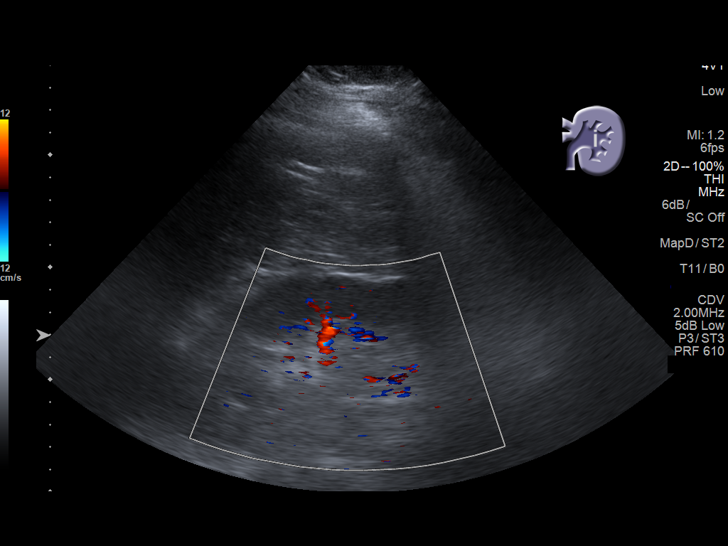
[im 86/94]
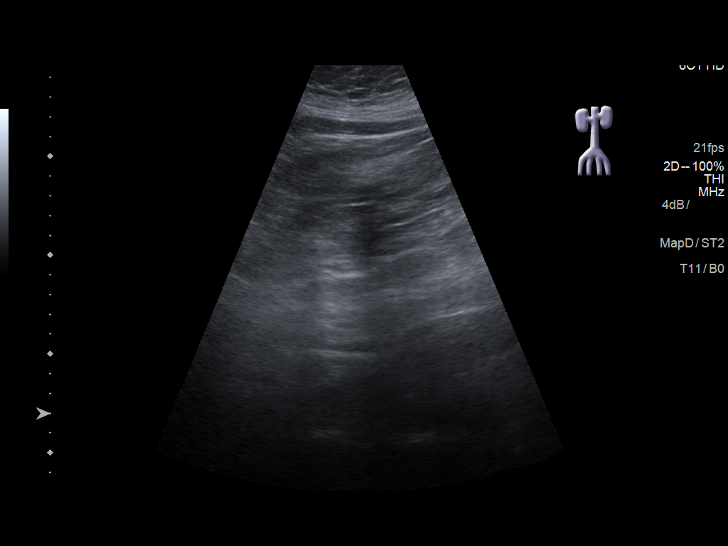
[im 94/94]
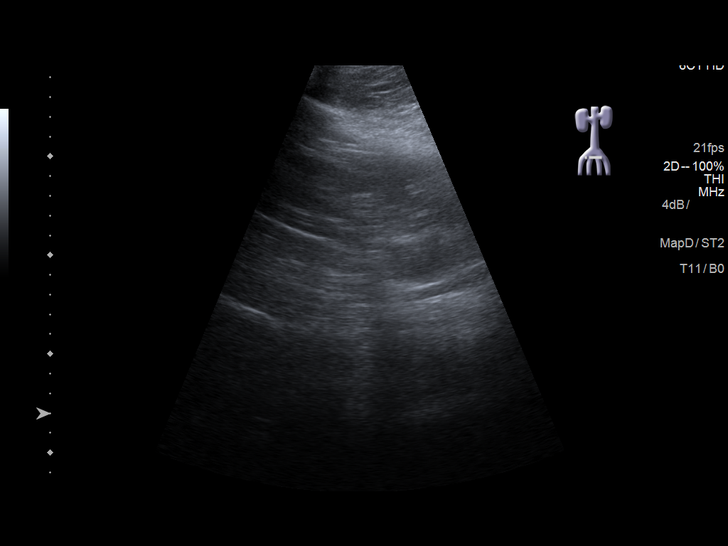

[13 of 25 positions shown; findings below may reference images not displayed]

FINDINGS: Gallbladder: The gallbladder is adequately distended. There are
multiple echogenic mobile shadowing stones. The largest measures 8
mm in diameter. There is no gallbladder wall thickening,
pericholecystic fluid, or positive sonographic Murphy's sign.

Common bile duct: Diameter: 4 mm

Liver: The hepatic echotexture is increased diffusely. The surface
contour of the liver is smooth. There is no focal mass or ductal
dilation. Portal vein is patent on color Doppler imaging with normal
direction of blood flow towards the liver.

IVC: No abnormality visualized.

Pancreas: Visualized portion unremarkable.

Spleen: Size and appearance within normal limits.

Right Kidney: Length: 14.6 cm. Echogenicity within normal limits. No
mass or hydronephrosis visualized.

Left Kidney: Length: 11.9 cm. Echogenicity within normal limits. No
mass or hydronephrosis visualized.

Abdominal aorta: No aneurysm visualized.

Other findings: None.
IMPRESSION: Technically limited study due to the patient's body habitus. Again
demonstrated are gallstones but no sonographic evidence of acute
cholecystitis.

Increased hepatic echotexture compatible with fatty infiltrative
change. No acute abnormalities are observed elsewhere in the
abdomen.

## 2019-01-23 DIAGNOSIS — G4733 Obstructive sleep apnea (adult) (pediatric): Secondary | ICD-10-CM | POA: Diagnosis not present

## 2019-08-04 ENCOUNTER — Ambulatory Visit: Payer: Self-pay | Attending: Internal Medicine

## 2019-08-04 DIAGNOSIS — Z23 Encounter for immunization: Secondary | ICD-10-CM | POA: Insufficient documentation

## 2019-08-04 NOTE — Progress Notes (Signed)
   Covid-19 Vaccination Clinic  Name:  Jeremy David    MRN: [id number] DOB: 10/08/1990  08/04/2019  Mr. Jeremy David was observed post Covid-19 immunization for 15 minutes without incident. He was provided with Vaccine Information Sheet and instruction to access the V-Safe system.   Mr. Jeremy David was instructed to call 911 with any severe reactions post vaccine: Marland Kitchen Difficulty breathing  . Swelling of face and throat  . A fast heartbeat  . A bad rash all over body  . Dizziness and weakness   Immunizations Administered    Name Date Dose VIS Date Route   Pfizer COVID-19 Vaccine 08/04/2019  8:45 AM 0.3 mL 05/10/2019 Intramuscular   Manufacturer: [employer], [employer]   Lot: [id number]   NDC: [address]-2

## 2019-08-26 ENCOUNTER — Ambulatory Visit: Payer: Self-pay | Attending: Internal Medicine

## 2019-08-26 DIAGNOSIS — Z23 Encounter for immunization: Secondary | ICD-10-CM

## 2019-08-26 NOTE — Progress Notes (Signed)
   Covid-19 Vaccination Clinic  Name:  Jeremy David    MRN: [id number] DOB: 1990-12-19  08/26/2019  Mr. [name] was observed post Covid-19 immunization for 15 minutes without incident. He was provided with Vaccine Information Sheet and instruction to access the V-Safe system.   Mr. [name] was instructed to call 911 with any severe reactions post vaccine: Marland Kitchen Difficulty breathing  . Swelling of face and throat  . A fast heartbeat  . A bad rash all over body  . Dizziness and weakness   Immunizations Administered    Name Date Dose VIS Date Route   Pfizer COVID-19 Vaccine 08/26/2019  8:54 AM 0.3 mL 05/10/2019 Intramuscular   Manufacturer: [employer], [employer]   Lot: [id number]   NDC: [address]-6

## 2019-11-25 ENCOUNTER — Telehealth: Payer: Self-pay | Admitting: Family Medicine

## 2019-11-25 NOTE — Telephone Encounter (Signed)
Pt has not been seen in office since Feb 2020 and states is under pressure from job to have yearly QFT and paperwork turned in and yearly wellness paperwork turned in. Pt was hoping to get paper work done on visit. Pt was told of Dr B situation, called office to see would or which dr would be able to sign off/fu with all request, office was busy and pt held till 5:00, please give call bk to pt at [phone]  Pt also needs late morning appt after 10:30 or so.

## 2019-11-26 NOTE — Telephone Encounter (Signed)
Left message for patient to call us back, I let him know this would require an office visit.  It sounds like he will need a CPE in order to complete the forms.

## 2019-12-03 NOTE — Progress Notes (Signed)
Complete physical exam   Patient: Jeremy David   DOB: 1991/01/08   29 y.o. Male  MRN: [id number] Visit Date: 12/05/2019  Today's healthcare provider: Vernie Murders, PA   Chief Complaint  Patient presents with   Annual Exam   Mertie Moores as a scribe for [employer], PA.,have documented all relevant documentation on the behalf of Vernie Murders, PA,as directed by  [employer], PA while in the presence of [employer], [place].  Subjective    Jeremy David is a 29 y.o. male who presents today for a complete physical exam.  He reports consuming a general diet. The patient does not participate in regular exercise at present. He generally feels fairly well. He reports sleeping fair. He does have additional problems to discuss today.     Past Medical History:  Diagnosis Date   Allergic rhinitis    Gallstones    Hypertension    Past Surgical History:  Procedure Laterality Date   NASAL POLYP EXCISION     Social History   Socioeconomic History   Marital status: Married    Spouse name: Ria Comment   Number of children: 0   Years of education: 15   Highest education level: Some college, no degree  Occupational History    Employer: [employer]    Comment: Currently out of work due to shoulder injury  Tobacco Use   Smoking status: Never Smoker   Smokeless tobacco: Never Used  Vaping Use   Vaping Use: Never used  Substance and Sexual Activity   Alcohol use: Yes    Alcohol/week: 1.0 standard drink    Types: 1 Standard drinks or equivalent per week   Drug use: Never   Sexual activity: Yes    Partners: Female    Birth control/protection: Condom  Other Topics Concern   Not on file  Social History Narrative   Not on file   Social Determinants of Health   Financial Resource Strain:    Difficulty of Paying Living Expenses:   Food Insecurity:    Worried About Charity fundraiser in the Last Year:    Arboriculturist in the Last Year:    Transportation Needs:    Film/video editor (Medical):    Lack of Transportation (Non-Medical):   Physical Activity:    Days of Exercise per Week:    Minutes of Exercise per Session:   Stress:    Feeling of Stress :   Social Connections:    Frequency of Communication with Friends and Family:    Frequency of Social Gatherings with Friends and Family:    Attends Religious Services:    Active Member of Clubs or Organizations:    Attends Music therapist:    Marital Status:   Intimate Partner Violence:    Fear of Current or Ex-Partner:    Emotionally Abused:    Physically Abused:    Sexually Abused:    Family Status  Relation Name Status   Mother  Alive   Father  (Not Specified)   Mat Aunt  (Not Specified)   MGM  (Not Specified)   Neg Hx  (Not Specified)   Family History  Problem Relation Age of Onset   Diabetes Mother    Hypertension Father    Breast cancer Maternal Aunt    Cancer Maternal Grandmother        unknown cancer type, thought to be related to being a smoker   Colon cancer Neg  Hx    Prostate cancer Neg Hx    Allergies  Allergen Reactions   Bee Venom Swelling   Peanut-Containing Drug Products     Has felt throat "closing" after eating peanuts   Molds & Smuts    Pollen Extract Other (See Comments)    Sneezing and wheezing    Patient Care Team: Virginia Crews, MD as PCP - General (Family Medicine)   Medications: Outpatient Medications Prior to Visit  Medication Sig   hydrochlorothiazide (HYDRODIURIL) 25 MG tablet Take 1 tablet (25 mg total) by mouth daily. (Patient not taking: Reported on 12/05/2019)   omeprazole (PRILOSEC) 20 MG capsule Take 1 capsule (20 mg total) by mouth daily. (Patient not taking: Reported on 12/05/2019)   traMADol (ULTRAM) 50 MG tablet Take by mouth every 6 (six) hours as needed. (Patient not taking: Reported on 12/05/2019)   No facility-administered medications prior to visit.    Review of Systems   Constitutional: Negative.   HENT: Positive for sneezing and tinnitus.   Eyes: Positive for photophobia.  Respiratory: Positive for apnea, cough and choking.   Cardiovascular: Positive for palpitations.  Gastrointestinal: Negative.   Endocrine: Negative.   Genitourinary: Negative.   Musculoskeletal: Positive for arthralgias, back pain, joint swelling, myalgias, neck pain and neck stiffness.  Skin: Negative.   Allergic/Immunologic: Positive for environmental allergies.  Neurological: Positive for dizziness and headaches.  Hematological: Negative.   Psychiatric/Behavioral: Positive for agitation, confusion and sleep disturbance. The patient is nervous/anxious.      Objective    BP (!) 179/97 (BP Location: Right Arm, Patient Position: Sitting, Cuff Size: Large)   Pulse 97   Temp (!) 96.6 F (35.9 C) (Temporal)   Ht '5\' 10"'  (1.778 m)   Wt (!) 440 lb 6.4 oz (199.8 kg)   BMI 63.19 kg/m  BP Readings from Last 3 Encounters:  12/05/19 (!) 179/97  07/16/18 (!) 143/84  04/11/18 139/82   Wt Readings from Last 3 Encounters:  12/05/19 (!) 440 lb 6.4 oz (199.8 kg)  07/16/18 (!) 400 lb 8 oz (181.7 kg)  04/11/18 (!) 410 lb (186 kg)      Physical Exam    General Appearance:    Alert, cooperative, no distress, appears stated age  Head:    Normocephalic, without obvious abnormality, atraumatic  Eyes:    PERRL, conjunctiva/corneas clear, EOM's intact, fundi    benign, both eyes       Ears:    Normal TM's and external ear canals, both ears  Nose:   Nares normal, septum midline, mucosa normal, no drainage   or sinus tenderness  Throat:   Lips, mucosa, and tongue normal; teeth and gums normal  Neck:   Supple, symmetrical, trachea midline, no adenopathy;       thyroid:  No enlargement/tenderness/nodules; no carotid   bruit or JVD  Back:     Symmetric, no curvature, ROM normal, no CVA tenderness  Lungs:     Clear to auscultation bilaterally, respirations unlabored  Chest wall:    No  tenderness or deformity  Heart:    Regular rate and rhythm, S1 and S2 normal, no murmur, rub   or gallop  Abdomen:     Soft, non-tender, bowel sounds active all four quadrants,    no masses, no organomegaly  Genitalia:    Normal male without lesion, discharge or tenderness  Rectal:    Normal tone, normal prostate, no masses or tenderness;   guaiac negative stool  Extremities:   Extremities  normal, atraumatic, no cyanosis or edema  Pulses:   2+ and symmetric all extremities  Skin:   Skin color, texture, turgor normal, no rashes or lesions  Lymph nodes:   Cervical, supraclavicular, and axillary nodes normal  Neurologic:   CNII-XII intact. Normal strength, sensation and reflexes      throughout     Last depression screening scores PHQ 2/9 Scores 12/05/2019 02/09/2018 12/07/2017  PHQ - 2 Score 3 0 0  PHQ- 9 Score 15 - 3   Last fall risk screening Fall Risk  12/05/2019  Falls in the past year? 0  Number falls in past yr: 0  Injury with Fall? 0  Follow up -   Last Audit-C alcohol use screening Alcohol Use Disorder Test (AUDIT) 12/05/2019  1. How often do you have a drink containing alcohol? 1  2. How many drinks containing alcohol do you have on a typical day when you are drinking? 0  3. How often do you have six or more drinks on one occasion? 0  AUDIT-C Score 1  Alcohol Brief Interventions/Follow-up AUDIT Score <7 follow-up not indicated   A score of 3 or more in women, and 4 or more in men indicates increased risk for alcohol abuse, EXCEPT if all of the points are from question 1   No results found for any visits on 12/05/19.  Assessment & Plan    Routine Health Maintenance and Physical Exam  Exercise Activities and Dietary recommendations Goals   Must work on weight loss with calorie reduction and walking for exercise 30 minutes 4 days a week.     Immunization History  Administered Date(s) Administered   DTaP 05/17/1989, 12/19/1989, 05/11/1990, 09/24/1990, 04/15/1993   HiB  (PRP-OMP) 07/02/1989, 12/19/1989, 03/01/1990, 07/03/1990   IPV 05/17/1989, 05/11/1990, 09/24/1990, 04/15/1993   MMR 07/03/1990, 04/15/1993   PFIZER SARS-COV-2 Vaccination 08/04/2019, 08/26/2019    Health Maintenance  Topic Date Due   Hepatitis C Screening  Never done   HIV Screening  Never done   TETANUS/TDAP  Never done   INFLUENZA VACCINE  12/29/2019   COVID-19 Vaccine  Completed    Discussed health benefits of physical activity, and encouraged him to engage in regular exercise appropriate for his age and condition.  1. Annual physical exam Counseled regarding morbidity and mortality impact of extreme obesity. Checking labs for metabolic disorders. Encouraged to get weight down and probably a candidate for bariatric surgery evaluation.  2. Essential hypertension Morbid obesity causing some worsening of hypertension. Check follow up labs. Should get back on the HCTZ 25 mg qd to control BP better. - Lipid Profile - TSH - CBC with Differential - Comprehensive Metabolic Panel (CMET)  3. Prediabetes Hgb A1C on 07-16-18 was 5.7%. Has gained 40 lbs in the past year and suspect progression to diabetes. Check CMP and Hgb A1C. - Comprehensive Metabolic Panel (CMET) - HgB A1c  4. BMI 60.0-69.9, adult ([place]) Morbid obesity worsening. Has gained 40 lbs in the past year. Counseled regarding eating habits, need for exercise and will get labs to check for progression to diabetes. Warned about weight impact on hypertension, sleep apnea and joint destruction. - Lipid Profile - TSH - CBC with Differential - Comprehensive Metabolic Panel (CMET) - HgB A1c  5. Obstructive sleep apnea syndrome Medically necessary for him to continue CPAP each night for 4-6 hours while sleeping.  6. Need for Tdap vaccination - Tdap vaccine greater than or equal to 7yo IM  7. Screening for tuberculosis - QuantiFERON-TB Gold Plus  8. Screening for HIV (human immunodeficiency virus) - HIV Antibody (routine  testing w rflx)  9. Need for hepatitis C screening test - Hepatitis C Antibody    No follow-ups on file.     Andres Shad, PA, have reviewed all documentation for this visit. The documentation on 12/05/19 for the exam, diagnosis, procedures, and orders are all accurate and complete.    Vernie Murders, [place] [phone] (phone) [phone] (fax)  [place]

## 2019-12-05 ENCOUNTER — Other Ambulatory Visit: Payer: Self-pay

## 2019-12-05 ENCOUNTER — Ambulatory Visit (INDEPENDENT_AMBULATORY_CARE_PROVIDER_SITE_OTHER): Payer: BC Managed Care – PPO | Admitting: Family Medicine

## 2019-12-05 ENCOUNTER — Encounter: Payer: Self-pay | Admitting: Family Medicine

## 2019-12-05 VITALS — BP 179/97 | HR 97 | Temp 96.6°F | Ht 70.0 in | Wt >= 6400 oz

## 2019-12-05 DIAGNOSIS — Z23 Encounter for immunization: Secondary | ICD-10-CM

## 2019-12-05 DIAGNOSIS — I10 Essential (primary) hypertension: Secondary | ICD-10-CM

## 2019-12-05 DIAGNOSIS — Z0000 Encounter for general adult medical examination without abnormal findings: Secondary | ICD-10-CM

## 2019-12-05 DIAGNOSIS — Z114 Encounter for screening for human immunodeficiency virus [HIV]: Secondary | ICD-10-CM

## 2019-12-05 DIAGNOSIS — Z1159 Encounter for screening for other viral diseases: Secondary | ICD-10-CM

## 2019-12-05 DIAGNOSIS — G4733 Obstructive sleep apnea (adult) (pediatric): Secondary | ICD-10-CM

## 2019-12-05 DIAGNOSIS — R7303 Prediabetes: Secondary | ICD-10-CM | POA: Diagnosis not present

## 2019-12-05 DIAGNOSIS — Z111 Encounter for screening for respiratory tuberculosis: Secondary | ICD-10-CM

## 2019-12-05 DIAGNOSIS — Z6841 Body Mass Index (BMI) 40.0 and over, adult: Secondary | ICD-10-CM

## 2020-02-06 NOTE — Progress Notes (Signed)
Established patient visit   Patient: Jeremy David   DOB: November 12, 1990   29 y.o. Male  MRN: [id number] Visit Date: 02/07/2020  Today's healthcare provider: Trey Sailors, PA-C   Chief Complaint  Patient presents with  . Urinary Frequency   Subjective    Urinary Frequency  This is a new problem. The current episode started 1 to 4 weeks ago. The problem occurs every urination. The problem has been unchanged. The quality of the pain is described as burning and aching. The pain is at a severity of 5/10. The pain is moderate. There has been no fever. Associated symptoms include flank pain, frequency, sweats and urgency. Pertinent negatives include no chills, discharge or hesitancy. He has tried nothing for the symptoms. The treatment provided no relief.  Patient reports back and right side pain.     Medications: Outpatient Medications Prior to Visit  Medication Sig  . hydrochlorothiazide (HYDRODIURIL) 25 MG tablet Take 1 tablet (25 mg total) by mouth daily.  Marland Kitchen omeprazole (PRILOSEC) 20 MG capsule Take 1 capsule (20 mg total) by mouth daily. (Patient not taking: Reported on 12/05/2019)  . traMADol (ULTRAM) 50 MG tablet Take by mouth every 6 (six) hours as needed. (Patient not taking: Reported on 12/05/2019)   No facility-administered medications prior to visit.    Review of Systems  Constitutional: Negative for chills.  Respiratory: Negative.   Genitourinary: Positive for flank pain, frequency and urgency. Negative for hesitancy.  Hematological: Negative.       Objective    BP (!) 142/86 (BP Location: Right Arm, Patient Position: Sitting, Cuff Size: Large)   Pulse 76   Temp 99 F (37.2 C) (Oral)   Wt (!) 425 lb 8 oz (193 kg)   SpO2 98%   BMI 61.05 kg/m    Physical Exam Constitutional:      Appearance: Normal appearance. He is obese.  Cardiovascular:     Rate and Rhythm: Normal rate and regular rhythm.     Heart sounds: Normal heart sounds.  Pulmonary:     Effort:  Pulmonary effort is normal.     Breath sounds: Normal breath sounds.  Abdominal:     General: Bowel sounds are normal.     Palpations: Abdomen is soft.     Tenderness: There is no right CVA tenderness or left CVA tenderness.  Skin:    General: Skin is warm and dry.  Neurological:     General: No focal deficit present.     Mental Status: He is alert and oriented to person, place, and time.  Psychiatric:        Mood and Affect: Mood normal.        Behavior: Behavior normal.       Results for orders placed or performed in visit on 02/07/20  Urine Culture   Urine  Result Value Ref Range   Urine Culture, Routine Preliminary report (A)    Organism ID, Bacteria Escherichia coli (A)    ORGANISM ID, BACTERIA Comment   Urine Microscopic  Result Value Ref Range   WBC, UA >30 (A) 0 - 5 /hpf   RBC >30 (A) 0 - 2 /hpf   Epithelial Cells (non renal) None seen 0 - 10 /hpf   Casts None seen None seen /lpf   Bacteria, UA None seen None seen/Few  POCT urinalysis dipstick  Result Value Ref Range   Color, UA Dark Yellow    Clarity, UA Cloudy    Glucose, UA Negative  Negative   Bilirubin, UA Negative    Ketones, UA Negative    Spec Grav, UA 1.020 1.010 - 1.025   Blood, UA Large +++    pH, UA 6.0 5.0 - 8.0   Protein, UA Negative Negative   Urobilinogen, UA 0.2 0.2 or 1.0 E.U./dL   Nitrite, UA Positive    Leukocytes, UA Large (3+) (A) Negative   Appearance     Odor      Assessment & Plan    1. Cystitis  - ciprofloxacin (CIPRO) 500 MG tablet; Take 1 tablet (500 mg total) by mouth 2 (two) times daily for 7 days.  Dispense: 14 tablet; Refill: 0  2. Urinary frequency  - POCT urinalysis dipstick - Urine Microscopic - Urine Culture    Return if symptoms worsen or fail to improve.      ITrey Sailors, PA-C, have reviewed all documentation for this visit. The documentation on 02/10/20 for the exam, diagnosis, procedures, and orders are all accurate and complete.  The entirety  of the information documented in the History of Present Illness, Review of Systems and Physical Exam were personally obtained by me. Portions of this information were initially documented by [facility] and reviewed by me for thoroughness and accuracy.     Maryella Shivers  [facility] [phone] (phone) [phone] (fax)  [facility]

## 2020-02-07 ENCOUNTER — Ambulatory Visit (INDEPENDENT_AMBULATORY_CARE_PROVIDER_SITE_OTHER): Payer: BC Managed Care – PPO | Admitting: Physician Assistant

## 2020-02-07 ENCOUNTER — Other Ambulatory Visit: Payer: Self-pay

## 2020-02-07 ENCOUNTER — Encounter: Payer: Self-pay | Admitting: Physician Assistant

## 2020-02-07 VITALS — BP 142/86 | HR 76 | Temp 99.0°F | Wt >= 6400 oz

## 2020-02-07 DIAGNOSIS — N3090 Cystitis, unspecified without hematuria: Secondary | ICD-10-CM | POA: Diagnosis not present

## 2020-02-07 DIAGNOSIS — R350 Frequency of micturition: Secondary | ICD-10-CM | POA: Diagnosis not present

## 2020-02-07 LAB — POCT URINALYSIS DIPSTICK
Bilirubin, UA: NEGATIVE
Glucose, UA: NEGATIVE
Ketones, UA: NEGATIVE
Nitrite, UA: POSITIVE
Protein, UA: NEGATIVE
Spec Grav, UA: 1.02 (ref 1.010–1.025)
Urobilinogen, UA: 0.2 E.U./dL
pH, UA: 6 (ref 5.0–8.0)

## 2020-02-07 MED ORDER — CIPROFLOXACIN HCL 500 MG PO TABS: 500.0000 mg | ORAL_TABLET | Freq: Two times a day (BID) | ORAL | 0 refills | Status: AC

## 2020-02-07 NOTE — Patient Instructions (Signed)

## 2020-02-10 LAB — URINE CULTURE

## 2020-02-10 LAB — URINALYSIS, MICROSCOPIC ONLY
Bacteria, UA: NONE SEEN
Casts: NONE SEEN /lpf
Epithelial Cells (non renal): NONE SEEN /hpf (ref 0–10)
RBC, Urine: >30 /hpf — AB (ref 0–2)
WBC, UA: >30 /hpf — AB (ref 0–5)

## 2020-05-25 ENCOUNTER — Ambulatory Visit: Payer: BC Managed Care – PPO | Admitting: Family Medicine

## 2020-05-25 NOTE — Progress Notes (Deleted)
Acute Office Visit  Subjective:    Patient ID: Jeremy David, male    DOB: 1991/05/25, 29 y.o.   MRN: [id number]  No chief complaint on file.   HPI Patient is in today for evaluation of elevated blood sugar.  Past Medical History:  Diagnosis Date  . Allergic rhinitis   . Gallstones   . Hypertension     Past Surgical History:  Procedure Laterality Date  . NASAL POLYP EXCISION      Family History  Problem Relation Age of Onset  . Diabetes Mother   . Hypertension Father   . Breast cancer Maternal Aunt   . Cancer Maternal Grandmother        unknown cancer type, thought to be related to being a smoker  . Colon cancer Neg Hx   . Prostate cancer Neg Hx     Social History   Socioeconomic History  . Marital status: Married    Spouse name: Lillia Abed  . Number of children: 0  . Years of education: 66  . Highest education level: Some college, no degree  Occupational History    Employer: [employer]    Comment: Currently out of work due to shoulder injury  Tobacco Use  . Smoking status: Never Smoker  . Smokeless tobacco: Never Used  Vaping Use  . Vaping Use: Never used  Substance and Sexual Activity  . Alcohol use: Yes    Alcohol/week: 1.0 standard drink    Types: 1 Standard drinks or equivalent per week  . Drug use: Never  . Sexual activity: Yes    Partners: Female    Birth control/protection: Condom  Other Topics Concern  . Not on file  Social History Narrative  . Not on file   Social Determinants of Health   Financial Resource Strain: Not on file  Food Insecurity: Not on file  Transportation Needs: Not on file  Physical Activity: Not on file  Stress: Not on file  Social Connections: Not on file  Intimate Partner Violence: Not on file    Outpatient Medications Prior to Visit  Medication Sig Dispense Refill  . hydrochlorothiazide (HYDRODIURIL) 25 MG tablet Take 1 tablet (25 mg total) by mouth daily. 90 tablet 3  . omeprazole (PRILOSEC)  20 MG capsule Take 1 capsule (20 mg total) by mouth daily. (Patient not taking: Reported on 12/05/2019) 30 capsule 5  . traMADol (ULTRAM) 50 MG tablet Take by mouth every 6 (six) hours as needed. (Patient not taking: Reported on 12/05/2019)     No facility-administered medications prior to visit.    Allergies  Allergen Reactions  . Bee Venom Swelling  . Peanut-Containing Drug Products     Has felt throat "closing" after eating peanuts  . Molds & Smuts   . Pollen Extract Other (See Comments)    Sneezing and wheezing    Review of Systems     Objective:    Physical Exam  There were no vitals taken for this visit. Wt Readings from Last 3 Encounters:  02/07/20 (!) 425 lb 8 oz (193 kg)  12/05/19 (!) 440 lb 6.4 oz (199.8 kg)  07/16/18 (!) 400 lb 8 oz (181.7 kg)    Health Maintenance Due  Topic Date Due  . Hepatitis C Screening  Never done  . INFLUENZA VACCINE  Never done    There are no preventive care reminders to display for this patient.   No results found for: TSH No results found for: WBC, HGB, HCT, MCV,  PLT Lab Results  Component Value Date   NA 140 07/16/2018   K 4.3 07/16/2018   CO2 20 07/16/2018   GLUCOSE 106 (H) 07/16/2018   BUN 11 07/16/2018   CREATININE 0.81 07/16/2018   BILITOT 1.2 03/21/2018   ALKPHOS 19 (L) 03/21/2018   AST 36 03/21/2018   ALT 72 (H) 03/21/2018   PROT 6.3 03/21/2018   ALBUMIN 4.3 03/21/2018   CALCIUM 8.9 07/16/2018   Lab Results  Component Value Date   CHOL 223 (H) 12/08/2017   Lab Results  Component Value Date   HDL 42 12/08/2017   Lab Results  Component Value Date   LDLCALC 162 (H) 12/08/2017   Lab Results  Component Value Date   TRIG 96 12/08/2017   Lab Results  Component Value Date   CHOLHDL 5.3 (H) 12/08/2017   Lab Results  Component Value Date   HGBA1C 5.7 (H) 07/16/2018       Assessment & Plan:   Problem List Items Addressed This Visit   None      No orders of the defined types were placed in this  encounter.    Adline Peals, CMA

## 2020-12-15 ENCOUNTER — Ambulatory Visit: Payer: Self-pay

## 2020-12-15 NOTE — Telephone Encounter (Signed)
Patient called, left VM to return the call to the office to discuss his shortness of breath.    Message from [place] sent at 12/15/2020  3:54 PM EDT  Patient stated he is going into work, and its ok if the nurse just calls him back tomorrow   ----- Message from [place] sent at 12/15/2020  3:42 PM EDT -----  Patient called in has been experiencing shortnes of breath for a while now and said he experienced some today while driving on the phone with me. Couldn't get nurse at the time. Please call back

## 2020-12-15 NOTE — Telephone Encounter (Signed)
Patient called, left VM to return the call to the office to speak to a nurse. Unable to reach patient, will route to office.

## 2020-12-16 NOTE — Telephone Encounter (Signed)
Patient called, left VM to return the call to the office. 

## 2020-12-16 NOTE — Telephone Encounter (Signed)
Patient called, left VM to return the call to the office to discuss SOB. Will route to the office, unable to reach patient after 2 attempts by [facility] NT.

## 2020-12-16 NOTE — Telephone Encounter (Signed)
Attempted to reach out to patient with no answer, left another vociemail to return office call. When patient returns call please have [facility] nurse triage patient with complaints of shortness of breath. No availability this week for appointment, at this time it would be suggested to seek medical attention at urgent care, also patient has option if he would like a virtual appointment to discuss we can contact [facility]  to see if there is any availability this week to be seen. KW

## 2020-12-16 NOTE — Telephone Encounter (Signed)
LMTCB please have [facility] nurse triage patient with complaints of shortness of breath. We do not have any openings today in office and it would be advised that patient seek immediate medical treatment at urgent care today with shortness of breath. KW

## 2020-12-17 NOTE — Telephone Encounter (Signed)
Attempted to call patient for triage per office request- left message for patient to call back. Note: Patient has been scheduled for visit tomorrow.

## 2020-12-18 ENCOUNTER — Ambulatory Visit: Payer: BC Managed Care – PPO | Admitting: Family Medicine

## 2020-12-18 NOTE — Progress Notes (Deleted)
Complete physical exam   Patient: Jeremy David   DOB: 12-22-1990   30 y.o. Male  MRN: [id number] Visit Date: 12/18/2020  Today's healthcare provider: Lavon Paganini, MD   No chief complaint on file.  Subjective    Jeremy David is a 30 y.o. male who presents today for a complete physical exam.  He reports consuming a {diet types:17450} diet. {Exercise:19826} He generally feels {well/fairly well/poorly:18703}. He reports sleeping {well/fairly well/poorly:18703}. He {does/does not:200015} have additional problems to discuss today.  HPI  ***  Past Medical History:  Diagnosis Date   Allergic rhinitis    Gallstones    Hypertension    Past Surgical History:  Procedure Laterality Date   NASAL POLYP EXCISION     Social History   Socioeconomic History   Marital status: Married    Spouse name: Ria Comment   Number of children: 0   Years of education: 15   Highest education level: Some college, no degree  Occupational History    Employer: [employer]    Comment: Currently out of work due to shoulder injury  Tobacco Use   Smoking status: Never   Smokeless tobacco: Never  Vaping Use   Vaping Use: Never used  Substance and Sexual Activity   Alcohol use: Yes    Alcohol/week: 1.0 standard drink    Types: 1 Standard drinks or equivalent per week   Drug use: Never   Sexual activity: Yes    Partners: Female    Birth control/protection: Condom  Other Topics Concern   Not on file  Social History Narrative   Not on file   Social Determinants of Health   Financial Resource Strain: Not on file  Food Insecurity: Not on file  Transportation Needs: Not on file  Physical Activity: Not on file  Stress: Not on file  Social Connections: Not on file  Intimate Partner Violence: Not on file   Family Status  Relation Name Status   Mother  Alive   Father  (Not Specified)   Mat Aunt  (Not Specified)   MGM  (Not Specified)   Neg Hx  (Not Specified)   Family  History  Problem Relation Age of Onset   Diabetes Mother    Hypertension Father    Breast cancer Maternal Aunt    Cancer Maternal Grandmother        unknown cancer type, thought to be related to being a smoker   Colon cancer Neg Hx    Prostate cancer Neg Hx    Allergies  Allergen Reactions   Bee Venom Swelling   Peanut-Containing Drug Products     Has felt throat "closing" after eating peanuts   Molds & Smuts    Pollen Extract Other (See Comments)    Sneezing and wheezing    Patient Care Team: Virginia Crews, MD as PCP - General (Family Medicine)   Medications: Outpatient Medications Prior to Visit  Medication Sig   hydrochlorothiazide (HYDRODIURIL) 25 MG tablet Take 1 tablet (25 mg total) by mouth daily.   omeprazole (PRILOSEC) 20 MG capsule Take 1 capsule (20 mg total) by mouth daily. (Patient not taking: Reported on 12/05/2019)   traMADol (ULTRAM) 50 MG tablet Take by mouth every 6 (six) hours as needed. (Patient not taking: Reported on 12/05/2019)   No facility-administered medications prior to visit.    Review of Systems  {Labs  Heme  Chem  Endocrine  Serology  Results Review (optional):23779}  Objective  There were no vitals taken for this visit. {Show previous vital signs (optional):23777}  Physical Exam  ***  Last depression screening scores PHQ 2/9 Scores 12/05/2019 02/09/2018 12/07/2017  PHQ - 2 Score 3 0 0  PHQ- 9 Score 15 - 3   Last fall risk screening Fall Risk  12/05/2019  Falls in the past year? 0  Number falls in past yr: 0  Injury with Fall? 0  Follow up -   Last Audit-C alcohol use screening Alcohol Use Disorder Test (AUDIT) 12/05/2019  1. How often do you have a drink containing alcohol? 1  2. How many drinks containing alcohol do you have on a typical day when you are drinking? 0  3. How often do you have six or more drinks on one occasion? 0  AUDIT-C Score 1  Alcohol Brief Interventions/Follow-up AUDIT Score <7 follow-up not indicated    A score of 3 or more in women, and 4 or more in men indicates increased risk for alcohol abuse, EXCEPT if all of the points are from question 1   No results found for any visits on 12/18/20.  Assessment & Plan    Routine Health Maintenance and Physical Exam  Exercise Activities and Dietary recommendations  Goals   None     Immunization History  Administered Date(s) Administered   DTaP 05/17/1989, 12/19/1989, 05/11/1990, 09/24/1990, 04/15/1993   HiB (PRP-OMP) 07/02/1989, 12/19/1989, 03/01/1990, 07/03/1990   MMR 07/03/1990, 04/15/1993   OPV 05/17/1989, 05/11/1990, 09/24/1990, 04/15/1993   PFIZER(Purple Top)SARS-COV-2 Vaccination 08/04/2019, 08/26/2019   Tdap 12/05/2019    Health Maintenance  Topic Date Due   Hepatitis C Screening  Never done   COVID-19 Vaccine (3 - Booster for Pfizer series) 01/26/2020   INFLUENZA VACCINE  12/28/2020   TETANUS/TDAP  12/04/2029   HIV Screening  Completed   Pneumococcal Vaccine 24-44 Years old  Aged Out   HPV VACCINES  Aged Out    Discussed health benefits of physical activity, and encouraged him to engage in regular exercise appropriate for his age and condition.  ***  No follow-ups on file.     {provider attestation***:1}   Lavon Paganini, MD  [facility] [phone] (phone) [phone] (fax)  [place]

## 2021-03-08 ENCOUNTER — Other Ambulatory Visit: Payer: Self-pay

## 2021-03-08 ENCOUNTER — Ambulatory Visit (INDEPENDENT_AMBULATORY_CARE_PROVIDER_SITE_OTHER): Payer: BC Managed Care – PPO | Admitting: Family Medicine

## 2021-03-08 ENCOUNTER — Encounter: Payer: Self-pay | Admitting: Family Medicine

## 2021-03-08 VITALS — BP 170/98 | HR 106 | Temp 99.0°F | Resp 16 | Ht 70.0 in | Wt >= 6400 oz

## 2021-03-08 DIAGNOSIS — Z6841 Body Mass Index (BMI) 40.0 and over, adult: Secondary | ICD-10-CM

## 2021-03-08 DIAGNOSIS — I10 Essential (primary) hypertension: Secondary | ICD-10-CM | POA: Diagnosis not present

## 2021-03-08 DIAGNOSIS — J3089 Other allergic rhinitis: Secondary | ICD-10-CM

## 2021-03-08 DIAGNOSIS — R7303 Prediabetes: Secondary | ICD-10-CM | POA: Diagnosis not present

## 2021-03-08 DIAGNOSIS — F329 Major depressive disorder, single episode, unspecified: Secondary | ICD-10-CM | POA: Insufficient documentation

## 2021-03-08 DIAGNOSIS — Z1159 Encounter for screening for other viral diseases: Secondary | ICD-10-CM

## 2021-03-08 DIAGNOSIS — K219 Gastro-esophageal reflux disease without esophagitis: Secondary | ICD-10-CM

## 2021-03-08 DIAGNOSIS — Z0000 Encounter for general adult medical examination without abnormal findings: Secondary | ICD-10-CM | POA: Diagnosis not present

## 2021-03-08 DIAGNOSIS — R1319 Other dysphagia: Secondary | ICD-10-CM

## 2021-03-08 DIAGNOSIS — F331 Major depressive disorder, recurrent, moderate: Secondary | ICD-10-CM

## 2021-03-08 MED ORDER — SERTRALINE HCL 50 MG PO TABS: 50.0000 mg | ORAL_TABLET | Freq: Every day | ORAL | 3 refills | Status: DC

## 2021-03-08 MED ORDER — CYCLOBENZAPRINE HCL 10 MG PO TABS: 10.0000 mg | ORAL_TABLET | Freq: Three times a day (TID) | ORAL | 0 refills | Status: DC | PRN

## 2021-03-08 MED ORDER — FLUTICASONE PROPIONATE 50 MCG/ACT NA SUSP: 2.0000 | Freq: Every day | NASAL | 6 refills | Status: DC

## 2021-03-08 MED ORDER — HYDROCHLOROTHIAZIDE 25 MG PO TABS: 25.0000 mg | ORAL_TABLET | Freq: Every day | ORAL | 3 refills | Status: DC

## 2021-03-08 MED ORDER — OMEPRAZOLE 20 MG PO CPDR: 20.0000 mg | DELAYED_RELEASE_CAPSULE | Freq: Every day | ORAL | 5 refills | Status: DC

## 2021-03-08 NOTE — Progress Notes (Signed)
Complete physical exam   Patient: Jeremy David   DOB: 1990-11-04   30 y.o. Male  MRN: [id number] Visit Date: 03/08/2021  Today's healthcare provider: Lavon Paganini, MD   Chief Complaint  Patient presents with   Annual Exam   Hypertension   Depression   Obesity   Subjective    Jeremy David is a 30 y.o. male who presents today for a complete physical exam.  He reports consuming a general diet. The patient does not participate in regular exercise at present. He generally feels fairly well. He reports sleeping poorly. He does have additional problems to discuss today.  HPI  Patient reports he has been off HCTZ for years, reports swelling of lower legs. Swelling is worse at the end of his day (morning)  Patient C/O R maxillary sinus pain and R neck pain worsening in the last 3 months. Patient reports taking Advil 636m once a day on and off. Reports mild pain control.   Difficulty swallowing intermittently. Not on PPI lately  R mid back pain. Came on after going to the bathroom at work a few weeks ago. Better now, but still intermittently. No leg numbness/weakness, perineal anesthesia.   ED recently after hurting back.  Past Medical History:  Diagnosis Date   Allergic rhinitis    Gallstones    Hypertension    Past Surgical History:  Procedure Laterality Date   NASAL POLYP EXCISION     Social History   Socioeconomic History   Marital status: Married    Spouse name: LRia Comment  Number of children: 0   Years of education: 15   Highest education level: Some college, no degree  Occupational History    Employer: [employer]    Comment: Currently out of work due to shoulder injury  Tobacco Use   Smoking status: Never   Smokeless tobacco: Never  Vaping Use   Vaping Use: Never used  Substance and Sexual Activity   Alcohol use: Yes    Alcohol/week: 1.0 standard drink    Types: 1 Standard drinks or equivalent per week   Drug use: Never   Sexual  activity: Yes    Partners: Female    Birth control/protection: Condom  Other Topics Concern   Not on file  Social History Narrative   Not on file   Social Determinants of Health   Financial Resource Strain: Not on file  Food Insecurity: Not on file  Transportation Needs: Not on file  Physical Activity: Not on file  Stress: Not on file  Social Connections: Not on file  Intimate Partner Violence: Not on file   Family Status  Relation Name Status   Mother  Alive   Father  (Not Specified)   Mat Aunt  (Not Specified)   MGM  (Not Specified)   Neg Hx  (Not Specified)   Family History  Problem Relation Age of Onset   Diabetes Mother    Hypertension Father    Breast cancer Maternal Aunt    Cancer Maternal Grandmother        unknown cancer type, thought to be related to being a David   Colon cancer Neg Hx    Prostate cancer Neg Hx    Allergies  Allergen Reactions   Bee Venom Swelling   Peanut-Containing Drug Products     Has felt throat "closing" after eating peanuts   Molds & Smuts    Pollen Extract Other (See Comments)    Sneezing and wheezing  Patient Care Team: Virginia Crews, MD as PCP - General (Family Medicine)   Medications: Outpatient Medications Prior to Visit  Medication Sig   [DISCONTINUED] hydrochlorothiazide (HYDRODIURIL) 25 MG tablet Take 1 tablet (25 mg total) by mouth daily. (Patient not taking: Reported on 03/08/2021)   [DISCONTINUED] omeprazole (PRILOSEC) 20 MG capsule Take 1 capsule (20 mg total) by mouth daily. (Patient not taking: No sig reported)   [DISCONTINUED] traMADol (ULTRAM) 50 MG tablet Take by mouth every 6 (six) hours as needed. (Patient not taking: No sig reported)   No facility-administered medications prior to visit.    Review of Systems  Constitutional:  Positive for appetite change, chills, fatigue and unexpected weight change.  HENT:  Positive for congestion, dental problem, drooling, ear pain, facial swelling, hearing  loss, postnasal drip, sinus pressure, sneezing, sore throat, tinnitus, trouble swallowing and voice change.   Eyes:  Positive for visual disturbance.  Respiratory:  Positive for apnea, cough, chest tightness, shortness of breath and wheezing.   Cardiovascular:  Positive for chest pain, palpitations and leg swelling.  Gastrointestinal:  Positive for abdominal distention, abdominal pain and nausea.  Endocrine: Positive for polydipsia.  Musculoskeletal:  Positive for arthralgias, back pain, myalgias, neck pain and neck stiffness.  Allergic/Immunologic: Positive for food allergies.  Neurological:  Positive for light-headedness and headaches.  Psychiatric/Behavioral:  Positive for confusion, decreased concentration and sleep disturbance. The patient is nervous/anxious.     Last metabolic panel Lab Results  Component Value Date   GLUCOSE 106 (H) 07/16/2018   NA 140 07/16/2018   K 4.3 07/16/2018   CL 103 07/16/2018   CO2 20 07/16/2018   BUN 11 07/16/2018   CREATININE 0.81 07/16/2018   GFRNONAA 120 07/16/2018   CALCIUM 8.9 07/16/2018   PROT 6.3 03/21/2018   ALBUMIN 4.3 03/21/2018   LABGLOB 2.0 03/21/2018   AGRATIO 2.2 03/21/2018   BILITOT 1.2 03/21/2018   ALKPHOS 19 (L) 03/21/2018   AST 36 03/21/2018   ALT 72 (H) 03/21/2018   Last lipids Lab Results  Component Value Date   CHOL 223 (H) 12/08/2017   HDL 42 12/08/2017   LDLCALC 162 (H) 12/08/2017   TRIG 96 12/08/2017   CHOLHDL 5.3 (H) 12/08/2017   Last hemoglobin A1c Lab Results  Component Value Date   HGBA1C 5.7 (H) 07/16/2018       Objective    BP (!) 170/98 (BP Location: Left Arm, Patient Position: Sitting, Cuff Size: Large)   Pulse (!) 106   Temp 99 F (37.2 C) (Oral)   Resp 16   Ht '5\' 10"'  (1.778 m)   Wt (!) 449 lb 6.4 oz (203.8 kg)   SpO2 98%   BMI 64.48 kg/m  BP Readings from Last 3 Encounters:  03/08/21 (!) 170/98  02/07/20 (!) 142/86  12/05/19 (!) 179/97   Wt Readings from Last 3 Encounters:  03/08/21  (!) 449 lb 6.4 oz (203.8 kg)  02/07/20 (!) 425 lb 8 oz (193 kg)  12/05/19 (!) 440 lb 6.4 oz (199.8 kg)      Physical Exam Vitals reviewed.  Constitutional:      General: He is not in acute distress.    Appearance: Normal appearance. He is well-developed. He is not diaphoretic.  HENT:     Head: Normocephalic and atraumatic.     Right Ear: Tympanic membrane, ear canal and external ear normal.     Left Ear: Tympanic membrane, ear canal and external ear normal.     Nose: Nose normal.  Mouth/Throat:     Mouth: Mucous membranes are moist.     Pharynx: Oropharynx is clear. No oropharyngeal exudate.  Eyes:     General: No scleral icterus.    Conjunctiva/sclera: Conjunctivae normal.     Pupils: Pupils are equal, round, and reactive to light.  Neck:     Thyroid: No thyromegaly.  Cardiovascular:     Rate and Rhythm: Normal rate and regular rhythm.     Pulses: Normal pulses.     Heart sounds: Normal heart sounds. No murmur heard. Pulmonary:     Effort: Pulmonary effort is normal. No respiratory distress.     Breath sounds: Normal breath sounds. No wheezing or rales.  Abdominal:     General: There is no distension.     Palpations: Abdomen is soft.     Tenderness: There is no abdominal tenderness.  Musculoskeletal:        General: No deformity.     Cervical back: Neck supple.     Right lower leg: Edema present.     Left lower leg: Edema present.  Lymphadenopathy:     Cervical: No cervical adenopathy.  Skin:    General: Skin is warm and dry.     Findings: No rash.  Neurological:     Mental Status: He is alert and oriented to person, place, and time. Mental status is at baseline.     Sensory: No sensory deficit.     Motor: No weakness.     Gait: Gait normal.  Psychiatric:        Mood and Affect: Mood normal.        Behavior: Behavior normal.        Thought Content: Thought content normal.      Last depression screening scores PHQ 2/9 Scores 03/08/2021 12/05/2019 02/09/2018   PHQ - 2 Score 4 3 0  PHQ- 9 Score 16 15 -   Last fall risk screening Fall Risk  03/08/2021  Falls in the past year? 1  Number falls in past yr: 0  Injury with Fall? 1  Risk for fall due to : No Fall Risks  Follow up Falls evaluation completed   Last Audit-C alcohol use screening Alcohol Use Disorder Test (AUDIT) 03/08/2021  1. How often do you have a drink containing alcohol? 2  2. How many drinks containing alcohol do you have on a typical day when you are drinking? 0  3. How often do you have six or more drinks on one occasion? 0  AUDIT-C Score 2  Alcohol Brief Interventions/Follow-up -   A score of 3 or more in women, and 4 or more in men indicates increased risk for alcohol abuse, EXCEPT if all of the points are from question 1   No results found for any visits on 03/08/21.  Assessment & Plan    Routine Health Maintenance and Physical Exam  Exercise Activities and Dietary recommendations  Goals   None     Immunization History  Administered Date(s) Administered   DTaP 05/17/1989, 12/19/1989, 05/11/1990, 09/24/1990, 04/15/1993   HiB (PRP-OMP) 07/02/1989, 12/19/1989, 03/01/1990, 07/03/1990   MMR 07/03/1990, 04/15/1993   OPV 05/17/1989, 05/11/1990, 09/24/1990, 04/15/1993   PFIZER(Purple Top)SARS-COV-2 Vaccination 08/04/2019, 08/26/2019   Tdap 12/05/2019    Health Maintenance  Topic Date Due   Hepatitis C Screening  Never done   COVID-19 Vaccine (3 - Booster for Pfizer series) 01/26/2020   INFLUENZA VACCINE  08/27/2021 (Originally 12/28/2020)   TETANUS/TDAP  12/04/2029   HIV Screening  Completed  HPV VACCINES  Aged Out    Discussed health benefits of physical activity, and encouraged him to engage in regular exercise appropriate for his age and condition.  Problem List Items Addressed This Visit       Cardiovascular and Mediastinum   Hypertension    Chronic and uncontrolled Not currently taking his HCTZ, so we will resume With significant elevation in his  blood pressure, he may need additional antihypertensive in the future Follow-up in 1 month Recheck metabolic panel      Relevant Medications   hydrochlorothiazide (HYDRODIURIL) 25 MG tablet   Other Relevant Orders   Comprehensive metabolic panel     Respiratory   Allergic rhinitis    Chronic and uncontrolled postnasal drip Start OTC antihistamine and Flonase        Digestive   GERD (gastroesophageal reflux disease)    Uncontrolled Prilosec was previously helpful Will resume      Relevant Medications   omeprazole (PRILOSEC) 20 MG capsule   Other Relevant Orders   Ambulatory referral to Gastroenterology   Esophageal dysphagia    New problem Of note, he has not been taking his PPI for his chronic GERD We will resume that as below Referral to GI for possible EGD if symptoms do not resolve with resumption of PPI      Relevant Orders   Ambulatory referral to Gastroenterology     Other   Prediabetes    Recommend low carb diet Recheck A1c      Relevant Orders   Hemoglobin A1c   Morbid obesity ([place])    Discussed importance of healthy weight management Discussed diet and exercise       Relevant Orders   Lipid Panel With LDL/HDL Ratio   MDD (major depressive disorder)    Longstanding, intermittent problem, but new diagnosis He does not think he has previously taken any psychiatric medications Will Start Zoloft 50 mg daily Discussed potential side effects, incl GI upset, sexual dysfunction, and SI Discussed that it can take 6-8 weeks to reach full efficacy Contracted for safety - no SI/HI Discussed synergistic effects of medications and therapy  Gave counseling resources Follow-up in 4 to 6 weeks and consider dose titration      Relevant Medications   sertraline (ZOLOFT) 50 MG tablet   Other Visit Diagnoses     Annual physical exam    -  Primary   Relevant Orders   Comprehensive metabolic panel   Lipid Panel With LDL/HDL Ratio   Hepatitis C antibody    Hemoglobin A1c   Encounter for hepatitis C screening test for low risk patient       Relevant Orders   Hepatitis C antibody   BMI 60.0-69.9, adult ([place])            Return in about 4 weeks (around 04/05/2021) for BP f/u.     I, Jeremy Paganini, MD, have reviewed all documentation for this visit. The documentation on 03/09/21 for the exam, diagnosis, procedures, and orders are all accurate and complete.   Dandra Velardi, Dionne Bucy, MD, MPH [facility]

## 2021-03-09 NOTE — Assessment & Plan Note (Signed)
Chronic and uncontrolled Not currently taking his HCTZ, so we will resume With significant elevation in his blood pressure, he may need additional antihypertensive in the future Follow-up in 1 month Recheck metabolic panel

## 2021-03-09 NOTE — Assessment & Plan Note (Signed)
Longstanding, intermittent problem, but new diagnosis He does not think he has previously taken any psychiatric medications Will Start Zoloft 50 mg daily Discussed potential side effects, incl GI upset, sexual dysfunction, and SI Discussed that it can take 6-8 weeks to reach full efficacy Contracted for safety - no SI/HI Discussed synergistic effects of medications and therapy  Gave counseling resources Follow-up in 4 to 6 weeks and consider dose titration

## 2021-03-09 NOTE — Assessment & Plan Note (Signed)
Recommend low carb diet °Recheck A1c  °

## 2021-03-09 NOTE — Assessment & Plan Note (Signed)
Chronic and uncontrolled postnasal drip Start OTC antihistamine and Flonase

## 2021-03-09 NOTE — Assessment & Plan Note (Signed)
New problem Of note, he has not been taking his PPI for his chronic GERD We will resume that as below Referral to GI for possible EGD if symptoms do not resolve with resumption of PPI

## 2021-03-09 NOTE — Assessment & Plan Note (Signed)
Uncontrolled Prilosec was previously helpful Will resume

## 2021-03-09 NOTE — Assessment & Plan Note (Signed)
Discussed importance of healthy weight management Discussed diet and exercise  

## 2021-03-16 ENCOUNTER — Ambulatory Visit: Payer: Self-pay

## 2021-03-16 NOTE — Telephone Encounter (Signed)
Pt c/o sharp sudden pain deep in his throat. Pt stated is lasted for a few seconds and stopped. Pt stated he smells like his breath is "morning breath." Pt denies  difficulty breathing, or drinking. Pt denies any pain now. Care advice given and pt verbalized understanding.       Reason for Disposition  [1] Mouth pain AND [2] present < 3 days  Answer Assessment - Initial Assessment Questions 1. ONSET: "When did the mouth start hurting?" (e.g., hours or days ago)       burning taste- pan was not of down in throat 2. SEVERITY: "How bad is the pain?" (Scale 1-10; mild, moderate or severe)   - MILD (1-3):  doesn't interfere with eating or normal activities   - MODERATE (4-7): interferes with eating some solids and normal activities   - SEVERE (8-10):  excruciating pain, interferes with most normal activities   - SEVERE DYSPHAGIA: can't swallow liquids, drooling    Sharp quick pain- larynx- few seconds 3. SORES: "Are there any sores or ulcers in the mouth?" If Yes, ask: "What part of the mouth are the sores in?"     no 4. FEVER: "Do you have a fever?" If Yes, ask: "What is your temperature, how was it measured, and when did it start?"     no 5. CAUSE: "What do you think is causing the mouth pain?"     Unknown  6. OTHER SYMPTOMS: "Do you have any other symptoms?" (e.g., difficulty breathing)     Pressure above left eye., strong pressure-has gal stones  Protocols used: Mouth Pain-A-AH

## 2021-04-06 NOTE — Progress Notes (Signed)
Established patient visit   Patient: Jeremy David   DOB: [birth year]   30 y.o. Male  MRN: [id number] Visit Date: 04/08/2021  Today's healthcare provider: Shirlee Latch, MD   Chief Complaint  Patient presents with   Follow-up    Subjective    HPI  Hypertension, follow-up  BP Readings from Last 3 Encounters:  04/08/21 (!) 142/90  03/08/21 (!) 170/98  02/07/20 (!) 142/86   Wt Readings from Last 3 Encounters:  04/08/21 (!) 446 lb (202.3 kg)  03/08/21 (!) 449 lb 6.4 oz (203.8 kg)  02/07/20 (!) 425 lb 8 oz (193 kg)     He was last seen for hypertension 1 weeks ago.  BP at that visit was 170/98. Management since that visit includes resume HCTZ.  He reports excellent compliance with treatment. He is not having side effects.  He reports that he is trying to cut down on his food intake. Doing smaller portions. He is not exercising. He does not smoke.  Outside blood pressures are not being checked. Symptoms: No chest pain No chest pressure  No palpitations No syncope  No dyspnea No orthopnea  No paroxysmal nocturnal dyspnea No lower extremity edema   Pertinent labs: Lab Results  Component Value Date   CHOL 223 (H) 12/08/2017   HDL 42 12/08/2017   LDLCALC 162 (H) 12/08/2017   TRIG 96 12/08/2017   CHOLHDL 5.3 (H) 12/08/2017   Lab Results  Component Value Date   NA 140 07/16/2018   K 4.3 07/16/2018   CREATININE 0.81 07/16/2018   GFRNONAA 120 07/16/2018   GLUCOSE 106 (H) 07/16/2018     The ASCVD Risk score (Arnett DK, et al., 2019) failed to calculate for the following reasons:   The 2019 ASCVD risk score is only valid for ages 81 to 62   ---------------------------------------------------------------------------------------------------  Depression, Follow-up  He  was last seen for this 4-6 weeks ago. Changes made at last visit include start Zoloft 50 mg daily.   He reports excellent compliance with treatment. He is not sure if having side effects.  Reports that his Anxiety has picked up since starting the Zoloft. Report that one day he was driving home and had an episode of feeling extremely hot and his taste was like ashes. It has only happened twice.  Current symptoms include: depressed mood and occasional insomnia. He feels he is Improved since last visit.  Depression screen [place] 2/9 03/08/2021 12/05/2019 02/09/2018  Decreased Interest 2 1 0  Down, Depressed, Hopeless 2 2 0  PHQ - 2 Score 4 3 0  Altered sleeping 3 3 -  Tired, decreased energy 2 3 -  Change in appetite 3 2 -  Feeling bad or failure about yourself  1 1 -  Trouble concentrating 1 1 -  Moving slowly or fidgety/restless 2 2 -  Suicidal thoughts 0 0 -  PHQ-9 Score 16 15 -  Difficult doing work/chores Somewhat difficult Not difficult at all -    GAD 7 : Generalized Anxiety Score 04/08/2021  Nervous, Anxious, on Edge 1  Control/stop worrying 1  Worry too much - different things 3  Trouble relaxing 1  Restless 0  Easily annoyed or irritable 0  Afraid - awful might happen 1  Total GAD 7 Score 7  Anxiety Difficulty Somewhat difficult     -----------------------------------------------------------------------------------------     Medications: Outpatient Medications Prior to Visit  Medication Sig   cyclobenzaprine (FLEXERIL) 10 MG tablet Take 1 tablet (10 mg  total) by mouth 3 (three) times daily as needed for muscle spasms.   fluticasone (FLONASE) 50 MCG/ACT nasal spray Place 2 sprays into both nostrils daily.   hydrochlorothiazide (HYDRODIURIL) 25 MG tablet Take 1 tablet (25 mg total) by mouth daily.   omeprazole (PRILOSEC) 20 MG capsule Take 1 capsule (20 mg total) by mouth daily.   [DISCONTINUED] sertraline (ZOLOFT) 50 MG tablet Take 1 tablet (50 mg total) by mouth daily.   No facility-administered medications prior to visit.    Review of Systems per HPI     Objective    BP (!) 142/90 (BP Location: Left Wrist, Patient Position: Sitting, Cuff Size:  Normal) Comment: 140/88  Pulse 92   Resp 16   Ht 5\' 10"  (1.778 m)   Wt (!) 446 lb (202.3 kg)   SpO2 95%   BMI 63.99 kg/m  {Show previous vital signs (optional):23777}  Physical Exam Vitals reviewed.  Constitutional:      General: He is not in acute distress.    Appearance: Normal appearance. He is not diaphoretic.  HENT:     Head: Normocephalic and atraumatic.  Eyes:     General: No scleral icterus.    Conjunctiva/sclera: Conjunctivae normal.  Cardiovascular:     Rate and Rhythm: Normal rate and regular rhythm.     Pulses: Normal pulses.     Heart sounds: Normal heart sounds. No murmur heard. Pulmonary:     Effort: Pulmonary effort is normal. No respiratory distress.     Breath sounds: Normal breath sounds. No wheezing or rhonchi.  Musculoskeletal:     Cervical back: Neck supple.     Right lower leg: No edema.     Left lower leg: No edema.  Lymphadenopathy:     Cervical: No cervical adenopathy.  Skin:    General: Skin is warm and dry.     Capillary Refill: Capillary refill takes less than 2 seconds.     Findings: No rash.  Neurological:     Mental Status: He is alert and oriented to person, place, and time.     Cranial Nerves: No cranial nerve deficit.  Psychiatric:        Mood and Affect: Mood normal.        Behavior: Behavior normal.      No results found for any visits on 04/08/21.  Assessment & Plan     Problem List Items Addressed This Visit       Cardiovascular and Mediastinum   Hypertension - Primary    Chronic, uncontrolled, but improving Continue HCTZ at current dose Add lisinopril 20 mg daily Discussed possible side effects Reminded him about getting labs from last visit      Relevant Medications   lisinopril (ZESTRIL) 20 MG tablet     Other   MDD (major depressive disorder)    Improving Increase zoloft to 100mg  daily to help more with anxiety Have discussed synergism between meds and therapy      Relevant Medications   sertraline  (ZOLOFT) 100 MG tablet   GAD (generalized anxiety disorder)    Uncontrolled Increase Zoloft to 100mg  daily      Relevant Medications   sertraline (ZOLOFT) 100 MG tablet     Return in about 4 weeks (around 05/06/2021) for BP f/u.      I, , MD, have reviewed all documentation for this visit. The documentation on 04/08/21 for the exam, diagnosis, procedures, and orders are all accurate and complete.   14/12/2020, MD,  [facility]

## 2021-04-08 ENCOUNTER — Encounter: Payer: Self-pay | Admitting: Family Medicine

## 2021-04-08 ENCOUNTER — Other Ambulatory Visit: Payer: Self-pay

## 2021-04-08 ENCOUNTER — Ambulatory Visit (INDEPENDENT_AMBULATORY_CARE_PROVIDER_SITE_OTHER): Payer: BC Managed Care – PPO | Admitting: Family Medicine

## 2021-04-08 VITALS — BP 142/90 | HR 92 | Resp 16 | Ht 70.0 in | Wt >= 6400 oz

## 2021-04-08 DIAGNOSIS — F331 Major depressive disorder, recurrent, moderate: Secondary | ICD-10-CM

## 2021-04-08 DIAGNOSIS — F411 Generalized anxiety disorder: Secondary | ICD-10-CM

## 2021-04-08 DIAGNOSIS — I10 Essential (primary) hypertension: Secondary | ICD-10-CM | POA: Diagnosis not present

## 2021-04-08 MED ORDER — SERTRALINE HCL 100 MG PO TABS
100.0000 mg | ORAL_TABLET | Freq: Every day | ORAL | 3 refills | Status: DC
Start: 2021-04-08 — End: 2021-08-16

## 2021-04-08 MED ORDER — LISINOPRIL 20 MG PO TABS: 20.0000 mg | ORAL_TABLET | Freq: Every day | ORAL | 3 refills | Status: DC

## 2021-04-08 NOTE — Assessment & Plan Note (Signed)
Uncontrolled Increase Zoloft to 100mg  daily

## 2021-04-08 NOTE — Assessment & Plan Note (Signed)
Improving Increase zoloft to 100mg  daily to help more with anxiety Have discussed synergism between meds and therapy

## 2021-04-08 NOTE — Assessment & Plan Note (Signed)
Chronic, uncontrolled, but improving Continue HCTZ at current dose Add lisinopril 20 mg daily Discussed possible side effects Reminded him about getting labs from last visit

## 2021-04-15 ENCOUNTER — Ambulatory Visit: Payer: BC Managed Care – PPO | Admitting: Family Medicine

## 2021-05-12 NOTE — Progress Notes (Deleted)
°  ° ° °  Established patient visit   Patient: Jeremy David   DOB: 01/06/1991   30 y.o. Male  MRN: [id number] Visit Date: 05/13/2021  Today's healthcare provider: Shirlee Latch, MD   No chief complaint on file.  Subjective    HPI  Hypertension, follow-up  BP Readings from Last 3 Encounters:  04/08/21 (!) 142/90  03/08/21 (!) 170/98  02/07/20 (!) 142/86   Wt Readings from Last 3 Encounters:  04/08/21 (!) 446 lb (202.3 kg)  03/08/21 (!) 449 lb 6.4 oz (203.8 kg)  02/07/20 (!) 425 lb 8 oz (193 kg)     He was last seen for hypertension 4 weeks ago.  BP at that visit was 142/90. Management since that visit includes Continue HCTZ at current dose Add lisinopril 20 mg daily.  He reports {excellent/good/fair/poor:19665} compliance with treatment. He {is/is not:9024} having side effects. {document side effects if present:1} He is following a {diet:21022986} diet. He {is/is not:9024} exercising. He does not smoke.  Outside blood pressures are {***enter patient reported home BP readings, or 'not being checked':1}. Symptoms: {Yes/No:20286} chest pain {Yes/No:20286} chest pressure  {Yes/No:20286} palpitations {Yes/No:20286} syncope  {Yes/No:20286} dyspnea {Yes/No:20286} orthopnea  {Yes/No:20286} paroxysmal nocturnal dyspnea {Yes/No:20286} lower extremity edema   Pertinent labs: Lab Results  Component Value Date   CHOL 223 (H) 12/08/2017   HDL 42 12/08/2017   LDLCALC 162 (H) 12/08/2017   TRIG 96 12/08/2017   CHOLHDL 5.3 (H) 12/08/2017   Lab Results  Component Value Date   NA 140 07/16/2018   K 4.3 07/16/2018   CREATININE 0.81 07/16/2018   GFRNONAA 120 07/16/2018   GLUCOSE 106 (H) 07/16/2018     The ASCVD Risk score (Arnett DK, et al., 2019) failed to calculate for the following reasons:   The 2019 ASCVD risk score is only valid for ages 89 to 55   ---------------------------------------------------------------------------------------------------   {Link to patient  history deactivated due to formatting error:1}  Medications: Outpatient Medications Prior to Visit  Medication Sig   cyclobenzaprine (FLEXERIL) 10 MG tablet Take 1 tablet (10 mg total) by mouth 3 (three) times daily as needed for muscle spasms.   fluticasone (FLONASE) 50 MCG/ACT nasal spray Place 2 sprays into both nostrils daily.   hydrochlorothiazide (HYDRODIURIL) 25 MG tablet Take 1 tablet (25 mg total) by mouth daily.   lisinopril (ZESTRIL) 20 MG tablet Take 1 tablet (20 mg total) by mouth daily.   omeprazole (PRILOSEC) 20 MG capsule Take 1 capsule (20 mg total) by mouth daily.   sertraline (ZOLOFT) 100 MG tablet Take 1 tablet (100 mg total) by mouth daily.   No facility-administered medications prior to visit.    Review of Systems  {Labs   Heme   Chem   Endocrine   Serology   Results Review (optional):23779}   Objective    There were no vitals taken for this visit. {Show previous vital signs (optional):23777}  Physical Exam  ***  No results found for any visits on 05/13/21.  Assessment & Plan     ***  No follow-ups on file.      {provider attestation***:1}   Shirlee Latch, MD  [facility] [phone] (phone) [phone] (fax)  [facility]

## 2021-05-13 ENCOUNTER — Ambulatory Visit: Payer: BC Managed Care – PPO | Admitting: Family Medicine

## 2021-08-16 ENCOUNTER — Telehealth: Payer: Self-pay | Admitting: Family Medicine

## 2021-08-16 MED ORDER — LISINOPRIL 20 MG PO TABS: 20.0000 mg | ORAL_TABLET | Freq: Every day | ORAL | 0 refills | Status: DC

## 2021-08-16 MED ORDER — SERTRALINE HCL 100 MG PO TABS: 100.0000 mg | ORAL_TABLET | Freq: Every day | ORAL | 0 refills | Status: DC

## 2021-08-16 NOTE — Telephone Encounter (Signed)
[facility] faxed refill request for the following medications:  ? ?sertraline (ZOLOFT) 100 MG tablet ? ?lisinopril (ZESTRIL) 20 MG tablet ? ?Please advise ? ?

## 2021-09-29 NOTE — Progress Notes (Signed)
?  ? ?I,Roshena L Chambers,acting as a scribe for Shirlee Latch, MD.,have documented all relevant documentation on the behalf of Shirlee Latch, MD,as directed by  Shirlee Latch, MD while in the presence of Shirlee Latch, MD.  ? ?Established patient visit ? ? ?Patient: Jeremy David   DOB: 1991/05/20   31 y.o. Male  MRN: [id number] ?Visit Date: 09/30/2021 ? ?Today's healthcare provider: Shirlee Latch, MD  ? ?Chief Complaint  ?Patient presents with  ? Hypertension  ? Obesity  ? ?Subjective  ?  ?HPI  ?Hypertension, follow-up ? ?BP Readings from Last 3 Encounters:  ?09/30/21 (!) 177/113  ?04/08/21 (!) 142/90  ?03/08/21 (!) 170/98  ? Wt Readings from Last 3 Encounters:  ?09/30/21 (!) 455 lb (206.4 kg)  ?04/08/21 (!) 446 lb (202.3 kg)  ?03/08/21 (!) 449 lb 6.4 oz (203.8 kg)  ?  ? ?He was last seen for hypertension 6 months ago.  ?BP at that visit was 142/90. Management since that visit includes continue HCTZ at current dose add Lisinopril 20mg  daily. ? ?He reports fair compliance with treatment. Patient reports Missing several days of medication each week. ?He is not having side effects.  ?He is following a Regular diet. ?He is not exercising. ?He does not smoke. ? ?Use of agents associated with hypertension: none.  ? ?Outside blood pressures are not checked. ?Symptoms: ?Yes chest pain No chest pressure  ?No palpitations No syncope  ?Yes dyspnea No orthopnea  ?No paroxysmal nocturnal dyspnea Yes lower extremity edema  ? ?Pertinent labs ?Lab Results  ?Component Value Date  ? CHOL 223 (H) 12/08/2017  ? HDL 42 12/08/2017  ? LDLCALC 162 (H) 12/08/2017  ? TRIG 96 12/08/2017  ? CHOLHDL 5.3 (H) 12/08/2017  ? Lab Results  ?Component Value Date  ? NA 140 07/16/2018  ? K 4.3 07/16/2018  ? CREATININE 0.81 07/16/2018  ? GFRNONAA 120 07/16/2018  ? GLUCOSE 106 (H) 07/16/2018  ?  ? ?The ASCVD Risk score (Arnett DK, et al., 2019) failed to calculate for the following reasons: ?  The 2019 ASCVD risk score is only valid  for ages 58 to 57 ? ?--------------------------------------------------------------------------------------------------- ?Follow up for obesity ? ?The patient was last seen for this 6 months ago. ?Changes made at last visit include discussed importance of healthy weight management. Discussed diet and exercise.  ?Patient is interested in weight loss surgery.  ?He reports poor compliance with treatment. Eats mostly frozen dinners. Works  ? Hours per week. No regular exercise. He feels that condition is Unchanged. ?He is not having side effects.  ? ?----------------------------------------------------------------------------------------- ? ? ?Medications: ?Outpatient Medications Prior to Visit  ?Medication Sig  ? cyclobenzaprine (FLEXERIL) 10 MG tablet Take 1 tablet (10 mg total) by mouth 3 (three) times daily as needed for muscle spasms.  ? fluticasone (FLONASE) 50 MCG/ACT nasal spray Place 2 sprays into both nostrils daily.  ? hydrochlorothiazide (HYDRODIURIL) 25 MG tablet Take 1 tablet (25 mg total) by mouth daily.  ? lisinopril (ZESTRIL) 20 MG tablet Take 1 tablet (20 mg total) by mouth daily. Please schedule office visit before any future refill.  ? omeprazole (PRILOSEC) 20 MG capsule Take 1 capsule (20 mg total) by mouth daily.  ? [DISCONTINUED] sertraline (ZOLOFT) 100 MG tablet Take 1 tablet (100 mg total) by mouth daily. Please schedule office visit before any future refill.  ? ?No facility-administered medications prior to visit.  ? ? ?Review of Systems  ?Constitutional:  Negative for appetite change, chills and fever.  ?Respiratory:  Positive  for shortness of breath. Negative for chest tightness and wheezing.   ?Cardiovascular:  Positive for chest pain and leg swelling. Negative for palpitations.  ?Gastrointestinal:  Negative for abdominal pain, nausea and vomiting.  ?Allergic/Immunologic: Positive for environmental allergies.  ? ? ?  Objective  ?  ?BP (!) 177/113 (BP Location: Left Wrist, Patient Position:  Sitting, Cuff Size: Large)   Pulse 94   Temp 98.7 ?F (37.1 ?C) (Oral)   Resp 18   Ht 5\' 10"  (1.778 m)   Wt (!) 455 lb (206.4 kg)   BMI 65.29 kg/m?  ? ?Today's Vitals  ? 09/30/21 1442 09/30/21 1443  ?BP: (!) 171/91 (!) 177/113  ?Pulse: 94   ?Resp: 18   ?Temp: 98.7 ?F (37.1 ?C)   ?TempSrc: Oral   ?Weight: (!) 455 lb (206.4 kg)   ?Height: 5\' 10"  (1.778 m)   ? ?Body mass index is 65.29 kg/m?.  ? ?Physical Exam ?Vitals reviewed.  ?Constitutional:   ?   General: He is not in acute distress. ?   Appearance: Normal appearance. He is not diaphoretic.  ?HENT:  ?   Head: Normocephalic and atraumatic.  ?Eyes:  ?   General: No scleral icterus. ?   Conjunctiva/sclera: Conjunctivae normal.  ?Cardiovascular:  ?   Rate and Rhythm: Normal rate and regular rhythm.  ?   Pulses: Normal pulses.  ?   Heart sounds: Normal heart sounds. No murmur heard. ?Pulmonary:  ?   Effort: Pulmonary effort is normal. No respiratory distress.  ?   Breath sounds: Normal breath sounds. No wheezing or rhonchi.  ?Abdominal:  ?   General: There is no distension.  ?   Palpations: Abdomen is soft.  ?   Tenderness: There is no abdominal tenderness.  ?Musculoskeletal:  ?   Cervical back: Neck supple.  ?   Right lower leg: Edema present.  ?   Left lower leg: Edema present.  ?Lymphadenopathy:  ?   Cervical: No cervical adenopathy.  ?Skin: ?   General: Skin is warm and dry.  ?   Capillary Refill: Capillary refill takes less than 2 seconds.  ?   Findings: Rash (lower legs) present.  ?Neurological:  ?   Mental Status: He is alert and oriented to person, place, and time.  ?   Cranial Nerves: No cranial nerve deficit.  ?Psychiatric:     ?   Mood and Affect: Mood normal.     ?   Behavior: Behavior normal.  ?  ? ? ?No results found for any visits on 09/30/21. ? Assessment & Plan  ?  ? ?Problem List Items Addressed This Visit   ? ?  ? Cardiovascular and Mediastinum  ? Hypertension  ?  Chronic and uncontrolled ?Has been having difficulty with compliance with  medications due to shift changing at work ?He is now can have a regular schedule at work again which will help him take his medications regularly as he was previously ?We will resume HCTZ 25 mg daily and lisinopril 20 mg daily ?Recheck metabolic panel ?At follow-up in 1 month, if BP still low, increase lisinopril dose ? ?  ?  ? Relevant Orders  ? Lipid panel  ? Comprehensive metabolic panel  ? Amb Referral to Bariatric Surgery  ?  ? Respiratory  ? Allergic rhinitis  ?  Would like allergy testing and may consider allergy shots ?Will refer to Allergy and immunology ? ?  ?  ? Relevant Orders  ? Ambulatory referral to Allergy  ?  ?  Musculoskeletal and Integument  ? Venous stasis dermatitis of both lower extremities  ?  New finding ?Mild ?Does have chronic lower extremity edema ?Encourage compression stockings ?Hydrocortisone and Vaseline as needed ? ?  ?  ?  ? Other  ? Prediabetes  ?  Recommend low carb diet ?Recheck A1c  ?  ?  ? Relevant Orders  ? Hemoglobin A1c  ? Amb Referral to Bariatric Surgery  ? Morbid obesity (HCC) - Primary  ?  Discussed importance of healthy weight management ?Discussed diet and exercise ?Discussed bariatric surgery options ?Given website to complete webinar ?Referral placed today ?  ?  ? Relevant Orders  ? Lipid panel  ? Comprehensive metabolic panel  ? Hemoglobin A1c  ? Amb Referral to Bariatric Surgery  ? MDD (major depressive disorder)  ?  Chronic and well-controlled ?In partial remission ?No longer taking Zoloft and feels like he is doing well and does not needed at this time-will be discontinued ? ?  ?  ?  ? ?Return in about 4 weeks (around 10/28/2021) for BP f/u.  ?   ? ?I, Shirlee LatchAngela Kilee Hedding, MD, have reviewed all documentation for this visit. The documentation on 09/30/21 for the exam, diagnosis, procedures, and orders are all accurate and complete. ? ? ?Erasmo DownerBacigalupo, Aston Lieske M, MD, MPH ? Family Practice ?[facility]   ?

## 2021-09-30 ENCOUNTER — Ambulatory Visit: Payer: BC Managed Care – PPO | Admitting: Family Medicine

## 2021-09-30 ENCOUNTER — Encounter: Payer: Self-pay | Admitting: Family Medicine

## 2021-09-30 DIAGNOSIS — R7303 Prediabetes: Secondary | ICD-10-CM

## 2021-09-30 DIAGNOSIS — F3341 Major depressive disorder, recurrent, in partial remission: Secondary | ICD-10-CM | POA: Diagnosis not present

## 2021-09-30 DIAGNOSIS — J301 Allergic rhinitis due to pollen: Secondary | ICD-10-CM

## 2021-09-30 DIAGNOSIS — I872 Venous insufficiency (chronic) (peripheral): Secondary | ICD-10-CM

## 2021-09-30 DIAGNOSIS — I10 Essential (primary) hypertension: Secondary | ICD-10-CM

## 2021-09-30 NOTE — Assessment & Plan Note (Signed)
Discussed importance of healthy weight management ?Discussed diet and exercise ?Discussed bariatric surgery options ?Given website to complete webinar ?Referral placed today ?

## 2021-09-30 NOTE — Patient Instructions (Signed)
[email or web address]   

## 2021-09-30 NOTE — Assessment & Plan Note (Signed)
Chronic and uncontrolled ?Has been having difficulty with compliance with medications due to shift changing at work ?He is now can have a regular schedule at work again which will help him take his medications regularly as he was previously ?We will resume HCTZ 25 mg daily and lisinopril 20 mg daily ?Recheck metabolic panel ?At follow-up in 1 month, if BP still low, increase lisinopril dose ?

## 2021-09-30 NOTE — Assessment & Plan Note (Signed)
Recommend low carb diet °Recheck A1c  °

## 2021-09-30 NOTE — Assessment & Plan Note (Signed)
Would like allergy testing and may consider allergy shots ?Will refer to Allergy and immunology ?

## 2021-09-30 NOTE — Assessment & Plan Note (Signed)
Chronic and well-controlled ?In partial remission ?No longer taking Zoloft and feels like he is doing well and does not needed at this time-will be discontinued ?

## 2021-09-30 NOTE — Assessment & Plan Note (Signed)
New finding ?Mild ?Does have chronic lower extremity edema ?Encourage compression stockings ?Hydrocortisone and Vaseline as needed ?

## 2021-10-14 ENCOUNTER — Encounter: Payer: Self-pay | Admitting: Family Medicine

## 2021-10-14 NOTE — Telephone Encounter (Signed)
Referral concern

## 2021-10-15 NOTE — Telephone Encounter (Signed)
[facility] is not taking new patient at this time.Pt was informed of this and given instructions for [facility] bariatric program

## 2021-10-16 ENCOUNTER — Encounter: Payer: Self-pay | Admitting: Family Medicine

## 2021-11-02 ENCOUNTER — Ambulatory Visit: Payer: BC Managed Care – PPO | Admitting: Family Medicine

## 2021-11-09 DIAGNOSIS — Z7689 Persons encountering health services in other specified circumstances: Secondary | ICD-10-CM | POA: Diagnosis not present

## 2021-11-09 DIAGNOSIS — R29898 Other symptoms and signs involving the musculoskeletal system: Secondary | ICD-10-CM | POA: Diagnosis not present

## 2021-11-09 DIAGNOSIS — M6281 Muscle weakness (generalized): Secondary | ICD-10-CM | POA: Diagnosis not present

## 2021-11-09 DIAGNOSIS — Z6841 Body Mass Index (BMI) 40.0 and over, adult: Secondary | ICD-10-CM | POA: Diagnosis not present

## 2021-11-09 DIAGNOSIS — Z713 Dietary counseling and surveillance: Secondary | ICD-10-CM | POA: Diagnosis not present

## 2021-11-16 DIAGNOSIS — I10 Essential (primary) hypertension: Secondary | ICD-10-CM | POA: Diagnosis not present

## 2021-11-16 DIAGNOSIS — R7303 Prediabetes: Secondary | ICD-10-CM | POA: Diagnosis not present

## 2021-11-17 LAB — COMPREHENSIVE METABOLIC PANEL
ALT: 107 IU/L — ABNORMAL HIGH (ref 0–44)
AST: 71 IU/L — ABNORMAL HIGH (ref 0–40)
Albumin/Globulin Ratio: 2.2 (ref 1.2–2.2)
Albumin: 4.4 g/dL (ref 4.0–5.0)
Alkaline Phosphatase: 21 IU/L — ABNORMAL LOW (ref 44–121)
BUN/Creatinine Ratio: 13 (ref 9–20)
BUN: 9 mg/dL (ref 6–20)
Bilirubin Total: 1.6 mg/dL — ABNORMAL HIGH (ref 0.0–1.2)
CO2: 21 mmol/L (ref 20–29)
Calcium: 9 mg/dL (ref 8.7–10.2)
Chloride: 103 mmol/L (ref 96–106)
Creatinine, Ser: 0.72 mg/dL — ABNORMAL LOW (ref 0.76–1.27)
Globulin, Total: 2 g/dL (ref 1.5–4.5)
Glucose: 119 mg/dL — ABNORMAL HIGH (ref 70–99)
Potassium: 4.4 mmol/L (ref 3.5–5.2)
Sodium: 140 mmol/L (ref 134–144)
Total Protein: 6.4 g/dL (ref 6.0–8.5)
eGFR: 124 mL/min/{1.73_m2} (ref >59)

## 2021-11-17 LAB — LIPID PANEL
Chol/HDL Ratio: 5.5 ratio — ABNORMAL HIGH (ref 0.0–5.0)
Cholesterol, Total: 204 mg/dL — ABNORMAL HIGH (ref 100–199)
HDL: 37 mg/dL — ABNORMAL LOW (ref >39)
LDL Chol Calc (NIH): 147 mg/dL — ABNORMAL HIGH (ref 0–99)
Triglycerides: 110 mg/dL (ref 0–149)
VLDL Cholesterol Cal: 20 mg/dL (ref 5–40)

## 2021-11-17 LAB — HEMOGLOBIN A1C
Est. average glucose Bld gHb Est-mCnc: 143 mg/dL
Hgb A1c MFr Bld: 6.6 % — ABNORMAL HIGH (ref 4.8–5.6)

## 2021-11-18 NOTE — Progress Notes (Signed)
I,Joseline E Rosas,acting as a scribe for Shirlee Latch, MD.,have documented all relevant documentation on the behalf of Shirlee Latch, MD,as directed by  Shirlee Latch, MD while in the presence of Shirlee Latch, MD.   Established patient visit   Patient: Jeremy David   DOB: 12-Mar-1991   31 y.o. Male  MRN: [id number] Visit Date: 11/19/2021  Today's healthcare provider: Shirlee Latch, MD   Chief Complaint  Patient presents with   follow-up BP   Subjective    HPI  Hypertension, follow-up  BP Readings from Last 3 Encounters:  11/19/21 (!) 170/100  09/30/21 (!) 177/113  04/08/21 (!) 142/90   Wt Readings from Last 3 Encounters:  11/19/21 (!) 444 lb 8 oz (201.6 kg)  09/30/21 (!) 455 lb (206.4 kg)  04/08/21 (!) 446 lb (202.3 kg)     He was last seen for hypertension 1 month ago.  BP at that visit was 177/113. Management since that visit includes will resume HCTZ 25 mg daily and lisinopril 20 mg daily. At follow-up in 1 month, if BP still low, increase lisinopril dose.  He reports poor compliance with treatment. He is not having side effects.  He is following a regular diet. He has cut out sweets He is exercising. He is doing 30 minutes of walking everyday.   Outside blood pressures are not being checked. Symptoms: No chest pain No chest pressure  No palpitations No syncope  No dyspnea No orthopnea  No paroxysmal nocturnal dyspnea No lower extremity edema   Pertinent labs Lab Results  Component Value Date   CHOL 204 (H) 11/16/2021   HDL 37 (L) 11/16/2021   LDLCALC 147 (H) 11/16/2021   TRIG 110 11/16/2021   CHOLHDL 5.5 (H) 11/16/2021   Lab Results  Component Value Date   NA 140 11/16/2021   K 4.4 11/16/2021   CREATININE 0.72 (L) 11/16/2021   EGFR 124 11/16/2021   GLUCOSE 119 (H) 11/16/2021     The ASCVD Risk score (Arnett DK, et al., 2019) failed to calculate for the following reasons:   The 2019 ASCVD risk score is only valid for ages  56 to 41  ---------------------------------------------------------------------------------------------------   New diagnosis of diabetes with last labs.  Not sure that he wants to do bariatric surgery at this time. Cut out all caffeine, sugars, sodas. Drinking more water. High protein diet.  Medications: Outpatient Medications Prior to Visit  Medication Sig   cyclobenzaprine (FLEXERIL) 10 MG tablet Take 1 tablet (10 mg total) by mouth 3 (three) times daily as needed for muscle spasms.   fluticasone (FLONASE) 50 MCG/ACT nasal spray Place 2 sprays into both nostrils daily.   hydrochlorothiazide (HYDRODIURIL) 25 MG tablet Take 1 tablet (25 mg total) by mouth daily.   omeprazole (PRILOSEC) 20 MG capsule Take 1 capsule (20 mg total) by mouth daily.   [DISCONTINUED] lisinopril (ZESTRIL) 20 MG tablet Take 1 tablet (20 mg total) by mouth daily. Please schedule office visit before any future refill.   No facility-administered medications prior to visit.    Review of Systems per HPI     Objective    BP (!) 170/100 (BP Location: Right Wrist, Patient Position: Sitting, Cuff Size: Large)   Pulse 86   Temp 99 F (37.2 C) (Oral)   Resp 16   Wt (!) 444 lb 8 oz (201.6 kg)   BMI 63.78 kg/m    Physical Exam Vitals reviewed.  Constitutional:      General: He is not in  acute distress.    Appearance: Normal appearance. He is not diaphoretic.  HENT:     Head: Normocephalic and atraumatic.  Eyes:     General: No scleral icterus.    Conjunctiva/sclera: Conjunctivae normal.  Cardiovascular:     Rate and Rhythm: Normal rate and regular rhythm.     Pulses: Normal pulses.     Heart sounds: Normal heart sounds. No murmur heard. Pulmonary:     Effort: Pulmonary effort is normal. No respiratory distress.     Breath sounds: Normal breath sounds. No wheezing or rhonchi.  Musculoskeletal:     Cervical back: Neck supple.     Right lower leg: No edema.     Left lower leg: No edema.   Lymphadenopathy:     Cervical: No cervical adenopathy.  Skin:    General: Skin is warm and dry.     Findings: No rash.  Neurological:     Mental Status: He is alert and oriented to person, place, and time. Mental status is at baseline.  Psychiatric:        Mood and Affect: Mood normal.        Behavior: Behavior normal.     Diabetic Foot Exam - Simple   Simple Foot Form Diabetic Foot exam was performed with the following findings: Yes 11/19/2021 10:09 AM  Visual Inspection No deformities, no ulcerations, no other skin breakdown bilaterally: Yes Sensation Testing Intact to touch and monofilament testing bilaterally: Yes Pulse Check Posterior Tibialis and Dorsalis pulse intact bilaterally: Yes Comments      No results found for any visits on 11/19/21.  Assessment & Plan     Problem List Items Addressed This Visit       Cardiovascular and Mediastinum   Hypertension associated with diabetes (HCC) - Primary    Chronic and uncontrolled Has been having difficulty remembering to take his medications - discussed strategies Resume HCTZ and lisinopril at previous dose Reviewed recent metabolic panel F/u in 1 m and if BP still high, will increase lisinopril dose      Relevant Medications   lisinopril (ZESTRIL) 20 MG tablet   Semaglutide,0.25 or 0.5MG /DOS, (OZEMPIC, 0.25 OR 0.5 MG/DOSE,) 2 MG/3ML SOPN     Endocrine   T2DM (type 2 diabetes mellitus) (HCC)    New diagnosis Progressed from prediabetes Complicated by HTN, HLD UTD on vaccines, discussed need for eye exam, foot exam and uacr today On ACEi Not on Statin Start ozempic (also for weight loss) Discussed diet and exercise F/u in 1 months and consider ozempic dose titration      Relevant Medications   lisinopril (ZESTRIL) 20 MG tablet   Semaglutide,0.25 or 0.5MG /DOS, (OZEMPIC, 0.25 OR 0.5 MG/DOSE,) 2 MG/3ML SOPN   Other Relevant Orders   Urine Microalbumin w/creat. ratio   Hyperlipidemia associated with type 2  diabetes mellitus (HCC)    Reviewed last lipid panel Not currently on a statin Reviewed FLP and CMP Discussed diet and exercise  Recheck in 57m      Relevant Medications   lisinopril (ZESTRIL) 20 MG tablet   Semaglutide,0.25 or 0.5MG /DOS, (OZEMPIC, 0.25 OR 0.5 MG/DOSE,) 2 MG/3ML SOPN     Other   Morbid obesity (HCC)    Discussed importance of healthy weight management Discussed diet and exercise Start ozempic as above Not planning on bariatric surgery at this time      Relevant Medications   Semaglutide,0.25 or 0.5MG /DOS, (OZEMPIC, 0.25 OR 0.5 MG/DOSE,) 2 MG/3ML SOPN   Elevated LFTs  Noted on last labs Suspect some fatty liver Avoid tylenol and alcohol Recheck at next f/u visit If still elevated, consider imaging        Return in about 4 weeks (around 12/17/2021) for chronic disease f/u.      I, Shirlee Latch, MD, have reviewed all documentation for this visit. The documentation on 11/19/21 for the exam, diagnosis, procedures, and orders are all accurate and complete.   Brylee Berk, Marzella Schlein, MD, MPH [facility]

## 2021-11-19 ENCOUNTER — Encounter: Payer: Self-pay | Admitting: Family Medicine

## 2021-11-19 ENCOUNTER — Ambulatory Visit: Payer: BC Managed Care – PPO | Admitting: Family Medicine

## 2021-11-19 VITALS — BP 170/100 | HR 86 | Temp 99.0°F | Resp 16 | Wt >= 6400 oz

## 2021-11-19 DIAGNOSIS — R7989 Other specified abnormal findings of blood chemistry: Secondary | ICD-10-CM | POA: Diagnosis not present

## 2021-11-19 DIAGNOSIS — E785 Hyperlipidemia, unspecified: Secondary | ICD-10-CM

## 2021-11-19 DIAGNOSIS — E1159 Type 2 diabetes mellitus with other circulatory complications: Secondary | ICD-10-CM | POA: Diagnosis not present

## 2021-11-19 DIAGNOSIS — E1169 Type 2 diabetes mellitus with other specified complication: Secondary | ICD-10-CM | POA: Insufficient documentation

## 2021-11-19 DIAGNOSIS — I152 Hypertension secondary to endocrine disorders: Secondary | ICD-10-CM

## 2021-11-19 MED ORDER — OZEMPIC (0.25 OR 0.5 MG/DOSE) 2 MG/3ML ~~LOC~~ SOPN: 0.2500 mg | PEN_INJECTOR | SUBCUTANEOUS | 1 refills | Status: DC

## 2021-11-19 MED ORDER — LISINOPRIL 20 MG PO TABS: 20.0000 mg | ORAL_TABLET | Freq: Every day | ORAL | 1 refills | Status: DC

## 2021-11-19 NOTE — Assessment & Plan Note (Signed)
Noted on last labs Suspect some fatty liver Avoid tylenol and alcohol Recheck at next f/u visit If still elevated, consider imaging

## 2021-11-21 LAB — MICROALBUMIN / CREATININE URINE RATIO
Creatinine, Urine: 40.3 mg/dL
Microalb/Creat Ratio: 16 mg/g creat (ref 0–29)
Microalbumin, Urine: 6.6 ug/mL

## 2021-11-24 ENCOUNTER — Telehealth: Payer: Self-pay

## 2021-11-24 NOTE — Telephone Encounter (Signed)
LM for patient to let him know that the appeal has been approved Appeal Case [id number] is overturned. (For Ozempic)

## 2021-11-25 ENCOUNTER — Other Ambulatory Visit: Payer: Self-pay

## 2021-11-25 MED ORDER — OZEMPIC (0.25 OR 0.5 MG/DOSE) 2 MG/3ML ~~LOC~~ SOPN: 0.2500 mg | PEN_INJECTOR | SUBCUTANEOUS | 1 refills | Status: DC

## 2021-11-25 NOTE — Telephone Encounter (Signed)
Per insurance medication needs to filled by mail order for 90 day supply. Sent a new prescription to [facility] home delivery.

## 2021-12-21 ENCOUNTER — Ambulatory Visit: Payer: BC Managed Care – PPO | Admitting: Family Medicine

## 2021-12-27 ENCOUNTER — Encounter: Payer: Self-pay | Admitting: Family Medicine

## 2022-01-11 ENCOUNTER — Ambulatory Visit: Payer: BC Managed Care – PPO | Admitting: Family Medicine

## 2022-01-11 ENCOUNTER — Encounter: Payer: Self-pay | Admitting: Family Medicine

## 2022-01-11 ENCOUNTER — Telehealth: Payer: Self-pay | Admitting: Family Medicine

## 2022-01-11 VITALS — BP 159/87 | HR 85 | Temp 98.4°F | Resp 16 | Ht 69.5 in | Wt >= 6400 oz

## 2022-01-11 DIAGNOSIS — E1169 Type 2 diabetes mellitus with other specified complication: Secondary | ICD-10-CM

## 2022-01-11 DIAGNOSIS — R7989 Other specified abnormal findings of blood chemistry: Secondary | ICD-10-CM

## 2022-01-11 DIAGNOSIS — I152 Hypertension secondary to endocrine disorders: Secondary | ICD-10-CM

## 2022-01-11 DIAGNOSIS — I872 Venous insufficiency (chronic) (peripheral): Secondary | ICD-10-CM | POA: Diagnosis not present

## 2022-01-11 DIAGNOSIS — E785 Hyperlipidemia, unspecified: Secondary | ICD-10-CM

## 2022-01-11 DIAGNOSIS — E1159 Type 2 diabetes mellitus with other circulatory complications: Secondary | ICD-10-CM

## 2022-01-11 MED ORDER — CYCLOBENZAPRINE HCL 10 MG PO TABS: 10.0000 mg | ORAL_TABLET | Freq: Three times a day (TID) | ORAL | 0 refills | Status: DC | PRN

## 2022-01-11 MED ORDER — TIRZEPATIDE 2.5 MG/0.5ML ~~LOC~~ SOAJ: 2.5000 mg | SUBCUTANEOUS | 0 refills | Status: DC

## 2022-01-11 NOTE — Progress Notes (Signed)
   SUBJECTIVE:   CHIEF COMPLAINT / HPI:   Hypertension: - Medications: lisinopril, HCTZ - Compliance: hasn't been taking.  - Checking BP at home: yes, unsure of numbers - Denies any SOB, CP, vision changes, medication SEs, or symptoms of hypotension  Diabetes, Type 2 - Last A1c 6.6 10/2021 - Medications: ozempic 0.25mg  - Compliance: not taking ozempic, made blood sugar drop rapidly. Dizzy, nauseous.  - Checking BG at home: yes, 90-110 - Diet: eats 3 meals per day with snacks.  - Exercise: difficult due to heat - Eye exam: due - Foot exam: UTD - Microalbumin: UTD - Statin: no   OBJECTIVE:   BP (!) 159/87 (BP Location: Right Wrist, Patient Position: Sitting, Cuff Size: Large)   Pulse 85   Temp 98.4 F (36.9 C) (Oral)   Resp 16   Ht 5' 9.5" (1.765 m)   Wt (!) 437 lb (198.2 kg)   BMI 63.61 kg/m   Gen: well appearing, in NAD, obese Card: Reg rate Lungs: Comfortable WOB on RA Ext: WWP, trace edema b/l   ASSESSMENT/PLAN:   Hypertension associated with diabetes (HCC) BP remains elevated but hasn't taken meds today, often forgets. Discussed ways to remember meds. Encouraged keeping BP log, bring to next appointment. Continue to work on weight loss as below. F/u 1 month.  Hyperlipidemia associated with type 2 diabetes mellitus (HCC) Will likely need statin, especially with new diagnosis of diabetes. Wanting to avoid multiple new med starts and work on diet, exercise, weight loss first. Recheck next month.  T2DM (type 2 diabetes mellitus) (HCC) With new diagnosis as of a few months ago. Did not tolerate ozempic, will trial 2.5mg  mounjaro. Continue efforts with diet, exercise. Encouraged eye exam appt. F/u 1 month.  Venous stasis dermatitis of both lower extremities Encouraged compression stockings.   Elevated LFTs Recheck labs, hep panel. Consider RUQ [place] if workup negative.  Morbid obesity (HCC) Contributing to DM, HTN, HLD. Did not tolerate ozempic, will trial low  dose mounjaro. Counseled on diet and exercise. F/u 1 month.     Caro Laroche, DO

## 2022-01-11 NOTE — Assessment & Plan Note (Signed)
Recheck labs, hep panel. Consider RUQ [place] if workup negative.

## 2022-01-11 NOTE — Assessment & Plan Note (Signed)
Encouraged compression stockings.

## 2022-01-11 NOTE — Telephone Encounter (Signed)
Received PA for [facility] and is pending. Will wait to see if insurance approves medication

## 2022-01-11 NOTE — Patient Instructions (Signed)
  Diet Recommendations for Diabetes   1. Eat at least 3 meals and 1-2 snacks per day. Never go more than 4-5 hours while awake without eating. Eat breakfast within the first hour of getting up.   2. Limit starchy foods to TWO per meal and ONE per snack. ONE portion of a starchy  food is equal to the following:   - ONE slice of bread (or its equivalent, such as half of a hamburger bun).   - 1/2 cup of a "scoopable" starchy food such as potatoes or rice.   - 15 grams of Total Carbohydrate as shown on food label.  3. Include at every meal: a protein food, a carb food, and vegetables and/or fruit.   - Obtain twice the volume of vegetables as protein or carbohydrate foods for both lunch and dinner.   - Fresh or frozen vegetables are best.   - Keep frozen vegetables on hand for a quick vegetable serving.       Starchy (carb) foods: Bread, rice, pasta, potatoes, corn, cereal, grits, crackers, bagels, muffins, all baked goods.  (Fruits, milk, and yogurt also have carbohydrate, but most of these foods will not spike your blood sugar as most starchy foods will.)  A few fruits do cause high blood sugars; use small portions of bananas (limit to 1/2 at a time), grapes, watermelon, oranges, and most tropical fruits.    Protein foods: Meat, fish, poultry, eggs, dairy foods, and beans such as pinto and kidney beans (beans also provide carbohydrate).     Look for opportunities to move your body throughout your day:  Never lie down when you can sit; never sit when you can stand; never stand when you can pace.  Moving your body throughout the day is just as important as the 30 or 60 minutes of exercise at the gym!  Get social Get active with your friends instead of going out to eat. Go for a hike, walk around the mall, or play an exercise-themed video game.   Move more at work Fit more activity into the workday. Stand during phone calls, use a printer farther from your desk, and get up to stretch each hour.     Do something new Develop a new skill to kick-start your motivation. Sign up for a class to learn how to [employer], surf, do tai chi, or play a sport.    Keep cool in the pool Don't like to sweat? Hit the local community pool for a swim, water polo, or water aerobics class to stay cool while exercising.    Stay on track Use a fitness tracker (FITBIT, Fitness Pal mobile app) to track your activity and provide motivation to reach your goals.

## 2022-01-11 NOTE — Assessment & Plan Note (Signed)
Will likely need statin, especially with new diagnosis of diabetes. Wanting to avoid multiple new med starts and work on diet, exercise, weight loss first. Recheck next month.

## 2022-01-11 NOTE — Assessment & Plan Note (Addendum)
With new diagnosis as of a few months ago. Did not tolerate ozempic, will trial 2.5mg  mounjaro. Continue efforts with diet, exercise. Encouraged eye exam appt. F/u 1 month.

## 2022-01-11 NOTE — Assessment & Plan Note (Signed)
Contributing to DM, HTN, HLD. Did not tolerate ozempic, will trial low dose mounjaro. Counseled on diet and exercise. F/u 1 month.

## 2022-01-11 NOTE — Assessment & Plan Note (Addendum)
BP remains elevated but hasn't taken meds today, often forgets. Discussed ways to remember meds. Encouraged keeping BP log, bring to next appointment. Continue to work on weight loss as below. F/u 1 month.

## 2022-01-11 NOTE — Telephone Encounter (Signed)
Patient called in stating he cannot afford Mounjaro. Is there anything else the provider can recommend please. Please assist patient further.

## 2022-01-12 LAB — ACUTE VIRAL HEPATITIS (HAV, HBV, HCV)
HCV Ab: NONREACTIVE
Hep A IgM: NEGATIVE
Hep B C IgM: NEGATIVE
Hepatitis B Surface Ag: NEGATIVE

## 2022-01-12 LAB — COMPREHENSIVE METABOLIC PANEL
ALT: 61 IU/L — ABNORMAL HIGH (ref 0–44)
AST: 30 IU/L (ref 0–40)
Albumin/Globulin Ratio: 2 (ref 1.2–2.2)
Albumin: 4.5 g/dL (ref 4.1–5.1)
Alkaline Phosphatase: 21 IU/L — ABNORMAL LOW (ref 44–121)
BUN/Creatinine Ratio: 17 (ref 9–20)
BUN: 13 mg/dL (ref 6–20)
Bilirubin Total: 1.6 mg/dL — ABNORMAL HIGH (ref 0.0–1.2)
CO2: 20 mmol/L (ref 20–29)
Calcium: 9.4 mg/dL (ref 8.7–10.2)
Chloride: 105 mmol/L (ref 96–106)
Creatinine, Ser: 0.75 mg/dL — ABNORMAL LOW (ref 0.76–1.27)
Globulin, Total: 2.2 g/dL (ref 1.5–4.5)
Glucose: 119 mg/dL — ABNORMAL HIGH (ref 70–99)
Potassium: 4.3 mmol/L (ref 3.5–5.2)
Sodium: 141 mmol/L (ref 134–144)
Total Protein: 6.7 g/dL (ref 6.0–8.5)
eGFR: 123 mL/min/{1.73_m2} (ref >59)

## 2022-01-12 LAB — HCV INTERPRETATION

## 2022-01-12 LAB — HEPATITIS C ANTIBODY: Hep C Virus Ab: NONREACTIVE

## 2022-01-13 MED ORDER — METFORMIN HCL ER (OSM) 1000 MG PO TB24: 1000.0000 mg | ORAL_TABLET | Freq: Every day | ORAL | 3 refills | Status: DC

## 2022-01-13 NOTE — Telephone Encounter (Signed)
Metformin sent to pharmacy. Patient is to keep f/u appt.

## 2022-01-13 NOTE — Telephone Encounter (Signed)
Jeremy David was denied. They want him to try Metformin first.

## 2022-01-13 NOTE — Telephone Encounter (Signed)
LMTCB- ok for P[facility] nurse to advise and schedule patient a 1 month follow-up HTN and DMT2

## 2022-01-17 ENCOUNTER — Encounter: Payer: Self-pay | Admitting: Family Medicine

## 2022-01-17 MED ORDER — METFORMIN HCL ER 500 MG PO TB24: 1000.0000 mg | ORAL_TABLET | Freq: Every day | ORAL | 1 refills | Status: DC

## 2022-02-15 DIAGNOSIS — Z6841 Body Mass Index (BMI) 40.0 and over, adult: Secondary | ICD-10-CM | POA: Diagnosis not present

## 2022-02-15 DIAGNOSIS — Z01810 Encounter for preprocedural cardiovascular examination: Secondary | ICD-10-CM | POA: Diagnosis not present

## 2022-02-15 DIAGNOSIS — I10 Essential (primary) hypertension: Secondary | ICD-10-CM | POA: Diagnosis not present

## 2022-02-15 DIAGNOSIS — E119 Type 2 diabetes mellitus without complications: Secondary | ICD-10-CM | POA: Diagnosis not present

## 2022-02-15 DIAGNOSIS — R0609 Other forms of dyspnea: Secondary | ICD-10-CM | POA: Diagnosis not present

## 2022-02-22 ENCOUNTER — Ambulatory Visit: Payer: BC Managed Care – PPO | Admitting: Family Medicine

## 2022-02-22 ENCOUNTER — Encounter: Payer: Self-pay | Admitting: Family Medicine

## 2022-02-22 VITALS — BP 172/91 | HR 96 | Temp 98.6°F | Resp 16 | Ht 69.5 in | Wt >= 6400 oz

## 2022-02-22 DIAGNOSIS — M542 Cervicalgia: Secondary | ICD-10-CM

## 2022-02-22 DIAGNOSIS — E1159 Type 2 diabetes mellitus with other circulatory complications: Secondary | ICD-10-CM | POA: Diagnosis not present

## 2022-02-22 DIAGNOSIS — M940 Chondrocostal junction syndrome [Tietze]: Secondary | ICD-10-CM | POA: Diagnosis not present

## 2022-02-22 DIAGNOSIS — K219 Gastro-esophageal reflux disease without esophagitis: Secondary | ICD-10-CM | POA: Diagnosis not present

## 2022-02-22 DIAGNOSIS — I152 Hypertension secondary to endocrine disorders: Secondary | ICD-10-CM

## 2022-02-22 MED ORDER — OMEPRAZOLE 20 MG PO CPDR: 20.0000 mg | DELAYED_RELEASE_CAPSULE | Freq: Every day | ORAL | 5 refills | Status: DC

## 2022-02-22 MED ORDER — SERTRALINE HCL 50 MG PO TABS: 50.0000 mg | ORAL_TABLET | Freq: Every day | ORAL | 0 refills | Status: DC

## 2022-02-22 MED ORDER — MELOXICAM 15 MG PO TABS: 15.0000 mg | ORAL_TABLET | Freq: Every day | ORAL | 0 refills | Status: DC

## 2022-02-22 NOTE — Assessment & Plan Note (Signed)
Chronic, worsened since not taking prilosec recently and recent weight gain. Likely cause of cough. Refilled prilosec. Continue to work on weight loss.

## 2022-02-22 NOTE — Progress Notes (Signed)
   SUBJECTIVE:   CHIEF COMPLAINT / HPI:   COUGH Duration: 1 week Cough description: non productive Aggravating factors:  exercise Alleviating factors: none Treatments attempted: none Shortness of breath:  with cough Chest pain: yes, sharp with deep breathing Chest tightness:no Nasal congestion: no Runny nose: no Hemoptysis: no Fevers: no Weight loss: no Heartburn: yes, has not been taking prilosec recently. Sick contacts: no  NECK PAIN  - R>L sided neck pain for the past 2 weeks - worse with rotation, R>L  Treatments attempted: ibuprofen, flexeril   Relief with NSAIDs?:  mild Location: b/l side of neck, occasionally suboccipital Duration:weeks Quality: sharp, ache Frequency: intermittent Radiation: no Aggravating factors: rotation, swallowing Alleviating factors: resting Weakness:  no Paresthesias / decreased sensation:   minimal in shoulders   Fevers:  no   OBJECTIVE:   BP (!) 172/91 (BP Location: Right Arm, Cuff Size: Large)   Pulse 96   Temp 98.6 F (37 C) (Oral)   Resp 16   Ht 5' 9.5" (1.765 m)   Wt (!) 445 lb (201.9 kg)   SpO2 99%   BMI 64.77 kg/m   Gen: well appearing, in NAD Card: RRR Chest: CTAB. Reproduction of pain when pressing on sternum. Ext: WWP, no edema MSK: Neck: full AROM in flexion, extension, sidebending, rotation with reproduction of pain with R rotation. Slight tenderness on palpation of SCM approaching clavicle. No midline cervical spine tenderness. Spurlings negative.    ASSESSMENT/PLAN:   Hypertension associated with diabetes ([place]) Elevated today but has not yet taken meds, encouraged compliance.   GERD (gastroesophageal reflux disease) Chronic, worsened since not taking prilosec recently and recent weight gain. Likely cause of cough. Refilled prilosec. Continue to work on weight loss.    Costochondritis, Neck pain New problem. Likely from irritation from increase in coughing, possible SCM spasm. Prilosec refilled to help  with cough and worsened GERD. Rx short-term meloxicam for anti-inflammatory.   Myles Gip, DO

## 2022-02-22 NOTE — Assessment & Plan Note (Signed)
Elevated today but has not yet taken meds, encouraged compliance.

## 2022-03-21 ENCOUNTER — Other Ambulatory Visit: Payer: Self-pay | Admitting: Family Medicine

## 2022-03-22 NOTE — Telephone Encounter (Signed)
Requested medication (s) are due for refill today: yes  Requested medication (s) are on the active medication list: yes  Last refill:  01/11/22 and 02/22/22  Future visit scheduled: yes  Notes to clinic:  Unable to refill per protocol, cannot delegate flexeril. Mobic needs review for labs, routing for approval.      Requested Prescriptions  Pending Prescriptions Disp Refills   cyclobenzaprine (FLEXERIL) 10 MG tablet [Pharmacy Med Name: CYCLOBENZAPRINE 10MG TABLETS] 30 tablet 0    Sig: TAKE 1 TABLET(10 MG) BY MOUTH THREE TIMES DAILY AS NEEDED FOR MUSCLE SPASMS     Not Delegated - Analgesics:  Muscle Relaxants Failed - 03/21/2022 10:54 AM      Failed - This refill cannot be delegated      Passed - Valid encounter within last 6 months    Recent Outpatient Visits           4 weeks ago Hypertension associated with diabetes [facility])   [place], [place], DO   2 months ago Elevated LFTs   [place], [place], DO   4 months ago Hypertension associated with diabetes [facility])   [facility], Dionne Bucy, MD   5 months ago Morbid obesity [facility])   [facility], Dionne Bucy, MD   11 months ago Primary hypertension   [facility] [place], Dionne Bucy, MD               meloxicam ([place]) 15 MG tablet [Pharmacy Med Name: MELOXICAM 15MG TABLETS] 30 tablet 0    Sig: TAKE 1 TABLET(15 MG) BY MOUTH DAILY     Analgesics:  COX2 Inhibitors Failed - 03/21/2022 10:54 AM      Failed - Manual Review: Labs are only required if the patient has taken medication for more than 8 weeks.      Failed - HGB in normal range and within 360 days    No results found for: "HGB", "HGBKUC", "HGBPOCKUC", "HGBOTHER", "TOTHGB", "HGBPLASMA"       Failed - Cr in normal range and within 360 days    Creatinine, Ser  Date Value Ref Range Status  01/11/2022 0.75 (L) 0.76 - 1.27 mg/dL Final         Failed - HCT in  normal range and within 360 days    No results found for: "HCT", "HCTKUC", "SRHCT"       Failed - ALT in normal range and within 360 days    ALT  Date Value Ref Range Status  01/11/2022 61 (H) 0 - 44 IU/L Final         Passed - AST in normal range and within 360 days    AST  Date Value Ref Range Status  01/11/2022 30 0 - 40 IU/L Final         Passed - eGFR is 30 or above and within 360 days    GFR calc Af Amer  Date Value Ref Range Status  07/16/2018 139 >59 mL/min/1.73 Final   GFR calc non Af Amer  Date Value Ref Range Status  07/16/2018 120 >59 mL/min/1.73 Final   eGFR  Date Value Ref Range Status  01/11/2022 123 >59 mL/min/1.73 Final         Passed - Patient is not pregnant      Passed - Valid encounter within last 12 months    Recent Outpatient Visits           4 weeks ago Hypertension  associated with diabetes [facility])   [place], DO   2 months ago Elevated LFTs   [facility] Myles Gip, DO   4 months ago Hypertension associated with diabetes [facility])   [facility] [place], Dionne Bucy, MD   5 months ago Morbid obesity [facility])   [facility], Dionne Bucy, MD   11 months ago Primary hypertension   [facility], Dionne Bucy, MD

## 2022-04-24 ENCOUNTER — Other Ambulatory Visit: Payer: Self-pay | Admitting: Family Medicine

## 2022-04-26 NOTE — Telephone Encounter (Signed)
Requested medication (s) are due for refill today: yes  Requested medication (s) are on the active medication list: yes  Last refill:  03/22/22  Future visit scheduled: no  Notes to clinic:  Unable to refill per protocol due to failed labs, no updated results.      Requested Prescriptions  Pending Prescriptions Disp Refills   meloxicam (MOBIC) 15 MG tablet [Pharmacy Med Name: MELOXICAM 15MG TABLETS] 30 tablet 0    Sig: TAKE 1 TABLET(15 MG) BY MOUTH DAILY     Analgesics:  COX2 Inhibitors Failed - 04/24/2022 11:13 AM      Failed - Manual Review: Labs are only required if the patient has taken medication for more than 8 weeks.      Failed - HGB in normal range and within 360 days    No results found for: "HGB", "HGBKUC", "HGBPOCKUC", "HGBOTHER", "TOTHGB", "HGBPLASMA"       Failed - Cr in normal range and within 360 days    Creatinine, Ser  Date Value Ref Range Status  01/11/2022 0.75 (L) 0.76 - 1.27 mg/dL Final         Failed - HCT in normal range and within 360 days    No results found for: "HCT", "HCTKUC", "SRHCT"       Failed - ALT in normal range and within 360 days    ALT  Date Value Ref Range Status  01/11/2022 61 (H) 0 - 44 IU/L Final         Passed - AST in normal range and within 360 days    AST  Date Value Ref Range Status  01/11/2022 30 0 - 40 IU/L Final         Passed - eGFR is 30 or above and within 360 days    GFR calc Af Amer  Date Value Ref Range Status  07/16/2018 139 >59 mL/min/1.73 Final   GFR calc non Af Amer  Date Value Ref Range Status  07/16/2018 120 >59 mL/min/1.73 Final   eGFR  Date Value Ref Range Status  01/11/2022 123 >59 mL/min/1.73 Final         Passed - Patient is not pregnant      Passed - Valid encounter within last 12 months    Recent Outpatient Visits           2 months ago Hypertension associated with diabetes [facility])   [place], [place], DO   3 months ago Elevated LFTs   [place], [place], DO   5 months ago Hypertension associated with diabetes [facility])   [facility], Dionne Bucy, MD   6 months ago Morbid obesity [facility])   [place], Dionne Bucy, MD   1 year ago Primary hypertension   [facility], Dionne Bucy, MD

## 2022-04-27 ENCOUNTER — Other Ambulatory Visit: Payer: Self-pay | Admitting: Family Medicine

## 2022-04-27 NOTE — Telephone Encounter (Signed)
Requested Prescriptions  Pending Prescriptions Disp Refills   metFORMIN (GLUCOPHAGE-XR) 500 MG 24 hr tablet [Pharmacy Med Name: METFORMIN ER 500MG 24HR TABS] 180 tablet 0    Sig: TAKE 2 TABLETS(1000 MG) BY MOUTH DAILY WITH BREAKFAST     Endocrinology:  Diabetes - Biguanides Failed - 04/27/2022  8:04 AM      Failed - Cr in normal range and within 360 days    Creatinine, Ser  Date Value Ref Range Status  01/11/2022 0.75 (L) 0.76 - 1.27 mg/dL Final         Failed - B12 Level in normal range and within 720 days    No results found for: "VITAMINB12"       Failed - CBC within normal limits and completed in the last 12 months    No results found for: "WBC", "WBCKUC" No results found for: "RBC", "RBCKUC" No results found for: "HGB", "HGBKUC", "HGBPOCKUC", "HGBOTHER", "TOTHGB", "HGBPLASMA", "LABHEMOF" No results found for: "HCT", "HCTKUC", "SRHCT" No results found for: "MCHC", "[place]" No results found for: "MCH", "[place]" No results found for: "[place]", "MCV" No results found for: "PLTCOUNTKUC", "LABPLAT", "POCPLA" No results found for: "RDW", "RDWKUC", "POCRDW"       Passed - HBA1C is between 0 and 7.9 and within 180 days    Hgb A1c MFr Bld  Date Value Ref Range Status  11/16/2021 6.6 (H) 4.8 - 5.6 % Final    Comment:             Prediabetes: 5.7 - 6.4          Diabetes: >6.4          Glycemic control for adults with diabetes: <7.0          Passed - eGFR in normal range and within 360 days    GFR calc Af Amer  Date Value Ref Range Status  07/16/2018 139 >59 mL/min/1.73 Final   GFR calc non Af Amer  Date Value Ref Range Status  07/16/2018 120 >59 mL/min/1.73 Final   eGFR  Date Value Ref Range Status  01/11/2022 123 >59 mL/min/1.73 Final         Passed - Valid encounter within last 6 months    Recent Outpatient Visits           2 months ago Hypertension associated with diabetes [facility])   [place], Jake Church, DO   3 months ago Elevated LFTs    [facility] Myles Gip, DO   5 months ago Hypertension associated with diabetes [facility])   [facility], Dionne Bucy, MD   6 months ago Morbid obesity [facility])   [place], Dionne Bucy, MD   1 year ago Primary hypertension   [facility] [place], Dionne Bucy, MD

## 2022-04-29 ENCOUNTER — Ambulatory Visit: Payer: Self-pay | Admitting: *Deleted

## 2022-04-29 NOTE — Telephone Encounter (Signed)
  Chief Complaint: increased heart rate Symptoms: increased heart rate, has not taken BP medication 2 weeks Frequency: started this morning Pertinent Negatives: Patient denies chest pain, difficulty breathing Disposition: [] ED /[x] Urgent Care (no appt availability in office) / [] Appointment(In office/virtual)/ []  [facility]/ [] Home Care/ [] Refused Recommended Disposition /[] [facility]/ []  Follow-up with PCP Additional Notes: Advised UC- patient needs to be back on medication- he lost it and can not afford to pay for RX.

## 2022-04-29 NOTE — Telephone Encounter (Signed)
Reason for Disposition  [1] Heart beating very rapidly (e.g., > 140 / minute) AND [2] not present now  (Exception: During exercise.)  Answer Assessment - Initial Assessment Questions 1. DESCRIPTION: "Please describe your heart rate or heartbeat that you are having" (e.g., fast/slow, regular/irregular, skipped or extra beats, "palpitations")     123- 3:57 pm 2. ONSET: "When did it start?" (Minutes, hours or days)      All day with activity 3. DURATION: "How long does it last" (e.g., seconds, minutes, hours)     Rest- couple minutes- 98-100 4. PATTERN "Does it come and go, or has it been constant since it started?"  "Does it get worse with exertion?"   "Are you feeling it now?"     Come and going, yes 5. TAP: "Using your hand, can you tap out what you are feeling on a chair or table in front of you, so that I can hear?" (Note: not all patients can do this)       na 6. HEART RATE: "Can you tell me your heart rate?" "How many beats in 15 seconds?"  (Note: not all patients can do this)       Normal 85-100 7. RECURRENT SYMPTOM: "Have you ever had this before?" If Yes, ask: "When was the last time?" and "What happened that time?"      Only once- light headed- patient was at work it got better 8. CAUSE: "What do you think is causing the palpitations?"     Patient states he got very excited today 9. CARDIAC HISTORY: "Do you have any history of heart disease?" (e.g., heart attack, angina, bypass surgery, angioplasty, arrhythmia)      No- supposed to be on medication- has not taken in 2 weeks 10. OTHER SYMPTOMS: "Do you have any other symptoms?" (e.g., dizziness, chest pain, sweating, difficulty breathing)       No- head /ears hot 11. PREGNANCY: "Is there any chance you are pregnant?" "When was your last menstrual period?"       na  Protocols used: Heart Rate and Heartbeat Questions-A-AH

## 2022-04-30 ENCOUNTER — Ambulatory Visit: Admit: 2022-04-30 | Disposition: A | Discharge status: Home / Self Care | Payer: BC Managed Care – PPO

## 2022-04-30 ENCOUNTER — Other Ambulatory Visit: Payer: Self-pay

## 2022-04-30 ENCOUNTER — Emergency Department: Payer: BC Managed Care – PPO

## 2022-04-30 ENCOUNTER — Emergency Department
Admission: EM | Admit: 2022-04-30 | Discharge: 2022-04-30 | Disposition: A | Discharge status: Home / Self Care | Payer: BC Managed Care – PPO | Attending: Emergency Medicine | Admitting: Emergency Medicine

## 2022-04-30 DIAGNOSIS — R079 Chest pain, unspecified: Secondary | ICD-10-CM | POA: Diagnosis not present

## 2022-04-30 DIAGNOSIS — E119 Type 2 diabetes mellitus without complications: Secondary | ICD-10-CM | POA: Diagnosis not present

## 2022-04-30 DIAGNOSIS — I10 Essential (primary) hypertension: Secondary | ICD-10-CM | POA: Diagnosis not present

## 2022-04-30 DIAGNOSIS — R002 Palpitations: Secondary | ICD-10-CM | POA: Diagnosis not present

## 2022-04-30 LAB — BASIC METABOLIC PANEL
Anion gap: 11 (ref 5–15)
BUN: 13 mg/dL (ref 6–20)
CO2: 24 mmol/L (ref 22–32)
Calcium: 9.4 mg/dL (ref 8.9–10.3)
Chloride: 106 mmol/L (ref 98–111)
Creatinine, Ser: 0.69 mg/dL (ref 0.61–1.24)
GFR, Estimated: >60 mL/min (ref >60)
Glucose, Bld: 134 mg/dL — ABNORMAL HIGH (ref 70–99)
Potassium: 4.1 mmol/L (ref 3.5–5.1)
Sodium: 141 mmol/L (ref 135–145)

## 2022-04-30 LAB — CBC
HCT: 43.6 % (ref 39.0–52.0)
Hemoglobin: 14.8 g/dL (ref 13.0–17.0)
MCH: 31 pg (ref 26.0–34.0)
MCHC: 33.9 g/dL (ref 30.0–36.0)
MCV: 91.2 fL (ref 80.0–100.0)
Platelets: 205 10*3/uL (ref 150–400)
RBC: 4.78 MIL/uL (ref 4.22–5.81)
RDW: 12.4 % (ref 11.5–15.5)
WBC: 7.4 10*3/uL (ref 4.0–10.5)
nRBC: 0 % (ref 0.0–0.2)

## 2022-04-30 LAB — TROPONIN I (HIGH SENSITIVITY)
Troponin I (High Sensitivity): 6 ng/L (ref <18)
Troponin I (High Sensitivity): 6 ng/L (ref <18)

## 2022-04-30 NOTE — ED Provider Notes (Signed)
[facility] Provider Note    Event Date/Time   First MD Initiated Contact with Patient 04/30/22 1446     (approximate)   History   Irregular Heart Beat   HPI  Jeremy David is a 31 y.o. male   Past medical history of hypertension, hyperlipidemia, diabetes and elevated BMI with sensation of heart palpitations since being chased by Rottweiler yesterday.  No injuries.  Does that he has an ongoing high heart rate and checks his Apple Watch frequently noting tachycardia in the low 100s.Marland Kitchen  No chest pain or shortness of breath.  No leg pain or swelling.  No changes in medications.  No recent illnesses, respiratory infectious symptoms.  History was obtained via the patient. His wife is available as an independent historian who corroborates information as given above. I reviewed external medical notes including primary care visit dated 02/22/2022 where they addressed hypertension, GERD, costochondritis.     Physical Exam   Triage Vital Signs: ED Triage Vitals  Enc Vitals Group     BP 04/30/22 1158 (!) 165/80     Pulse Rate 04/30/22 1158 (!) 107     Resp 04/30/22 1158 16     Temp 04/30/22 1158 98.4 F (36.9 C)     Temp Source 04/30/22 1158 Oral     SpO2 04/30/22 1158 96 %     Weight 04/30/22 1143 (!) 445 lb 5.3 oz (202 kg)     Height 04/30/22 1143 5\' 9"  (1.753 m)     Head Circumference --      Peak Flow --      Pain Score 04/30/22 1143 1     Pain Loc --      Pain Edu? --      Excl. in GC? --     Most recent vital signs: Vitals:   04/30/22 1158 04/30/22 1533  BP: (!) 165/80   Pulse: (!) 107 (!) 106  Resp: 16   Temp: 98.4 F (36.9 C)   SpO2: 96%     General: Awake, no distress.  CV:  Good peripheral perfusion.  Resp:  Normal effort.  Abd:  No distention.  Soft and nontender abdomen. Other:  Comfortable, awake alert and oriented pleasant patient in no acute distress.  Lungs clear to auscultation bilaterally, skin appears warm and well-perfused.   Tachycardia in the low 100s and hypertension 160/80, no fever.   ED Results / Procedures / Treatments   Labs (all labs ordered are listed, but only abnormal results are displayed) Labs Reviewed  BASIC METABOLIC PANEL - Abnormal; Notable for the following components:      Result Value   Glucose, Bld 134 (*)    All other components within normal limits  CBC  TROPONIN I (HIGH SENSITIVITY)  TROPONIN I (HIGH SENSITIVITY)     I reviewed labs and they are notable for troponin is 6, 6  EKG  ED ECG REPORT I, 14/02/23, the attending physician, personally viewed and interpreted this ECG.   Date: 04/30/2022  EKG Time: 1203  Rate: 98  Rhythm: normal sinus rhythm  Axis: nl  Intervals:none  ST&T Change: No acute ischemic changes.    RADIOLOGY I independently reviewed and interpreted chest x-ray and see no obvious focalities or pneumothorax   PROCEDURES:  Critical Care performed: No  Procedures   MEDICATIONS ORDERED IN ED: Medications - No data to display  IMPRESSION / MDM / ASSESSMENT AND PLAN / ED COURSE  I reviewed the triage vital signs  and the nursing notes.                              Differential diagnosis includes, but is not limited to, cardiac dysrhythmia, ACS, electrolyte disturbance, dehydration, anxiety/stress, considered but less likely infection, PE, withdrawal   MDM: Patient with asymptomatic tachycardia in the setting of stressors yesterday, continues to be anxious about his high heart rate and continuously checks his watch.  No other associated symptoms.  I doubt emergent cardiopulmonary pathology, EKG is nonischemic, troponins flat x2 and basic blood work within normal limits.  He otherwise appears well and comfortable, I encouraged him to follow-up with his primary doctor for hypertension and come back to the emergency department if he develops any new or worsening symptoms.  Patient's presentation is most consistent with acute presentation with  potential threat to life or bodily function.       FINAL CLINICAL IMPRESSION(S) / ED DIAGNOSES   Final diagnoses:  Palpitations     Rx / DC Orders   ED Discharge Orders     None        Note:  This document was prepared using Dragon voice recognition software and may include unintentional dictation errors.    Pilar Jarvis, MD 04/30/22 [phone]

## 2022-04-30 NOTE — Discharge Instructions (Signed)
   Thank you for choosing us for your health care today!  Please see your primary doctor this week for a follow up appointment.   If you do not have a primary doctor call the following clinics to establish care:  If you have insurance:  [facility] [phone] [address] [place] [address]   [facility]  [phone] [address] [place] [address]   If you do not have insurance:  [facility]  [phone] [address] [place]  [address]  Sometimes, in the early stages of certain disease courses it is difficult to detect in the emergency department evaluation -- so, it is important that you continue to monitor your symptoms and call your doctor right away or return to the emergency department if you develop any new or worsening symptoms.  It was my pleasure to care for you today.   Kenesha Moshier S. Sema Stangler, MD  

## 2022-04-30 NOTE — ED Triage Notes (Signed)
Pt reports yesterday started with elevated heart rate after a run in with a rottweiller. Pt reports his HR has still not come down. Pt also reports some tightness in left shoulder. Pt reports was SOB yesterday but none today.

## 2022-05-28 ENCOUNTER — Other Ambulatory Visit: Payer: Self-pay | Admitting: Family Medicine

## 2022-05-29 NOTE — Telephone Encounter (Signed)
Requested Prescriptions  Pending Prescriptions Disp Refills   sertraline (ZOLOFT) 50 MG tablet [Pharmacy Med Name: SERTRALINE 50MG  TABLETS] 90 tablet 0    Sig: TAKE 1 TABLET(50 MG) BY MOUTH DAILY     Psychiatry:  Antidepressants - SSRI - sertraline Failed - 05/28/2022 11:14 AM      Failed - ALT in normal range and within 360 days    ALT  Date Value Ref Range Status  01/11/2022 61 (H) 0 - 44 IU/L Final         Passed - AST in normal range and within 360 days    AST  Date Value Ref Range Status  01/11/2022 30 0 - 40 IU/L Final         Passed - Completed PHQ-2 or PHQ-9 in the last 360 days      Passed - Valid encounter within last 6 months    Recent Outpatient Visits           3 months ago Hypertension associated with diabetes [facility])   [facility] [place], Manuel Garcia, DO   4 months ago Elevated LFTs   [facility] M, DO   6 months ago Hypertension associated with diabetes [facility])   [facility], [facility], MD   8 months ago Morbid obesity [facility])   [facility], [facility], MD   1 year ago Primary hypertension   [facility] [place], [place], MD

## 2022-05-29 NOTE — Telephone Encounter (Signed)
Requested Prescriptions  Pending Prescriptions Disp Refills   meloxicam (MOBIC) 15 MG tablet [Pharmacy Med Name: MELOXICAM 15MG TABLETS] 30 tablet 0    Sig: TAKE 1 TABLET(15 MG) BY MOUTH DAILY     Analgesics:  COX2 Inhibitors Failed - 05/28/2022 11:13 AM      Failed - Manual Review: Labs are only required if the patient has taken medication for more than 8 weeks.      Failed - ALT in normal range and within 360 days    ALT  Date Value Ref Range Status  01/11/2022 61 (H) 0 - 44 IU/L Final         Passed - HGB in normal range and within 360 days    Hemoglobin  Date Value Ref Range Status  04/30/2022 14.8 13.0 - 17.0 g/dL Final         Passed - Cr in normal range and within 360 days    Creatinine, Ser  Date Value Ref Range Status  04/30/2022 0.69 0.61 - 1.24 mg/dL Final         Passed - HCT in normal range and within 360 days    HCT  Date Value Ref Range Status  04/30/2022 43.6 39.0 - 52.0 % Final         Passed - AST in normal range and within 360 days    AST  Date Value Ref Range Status  01/11/2022 30 0 - 40 IU/L Final         Passed - eGFR is 30 or above and within 360 days    GFR calc Af Amer  Date Value Ref Range Status  07/16/2018 139 >59 mL/min/1.73 Final   GFR, Estimated  Date Value Ref Range Status  04/30/2022 >60 >60 mL/min Final    Comment:    (NOTE) Calculated using the CKD-EPI Creatinine Equation (2021)    eGFR  Date Value Ref Range Status  01/11/2022 123 >59 mL/min/1.73 Final         Passed - Patient is not pregnant      Passed - Valid encounter within last 12 months    Recent Outpatient Visits           3 months ago Hypertension associated with diabetes [facility])   [place], [place], DO   4 months ago Elevated LFTs   [facility] Rory Percy M, DO   6 months ago Hypertension associated with diabetes [facility])   [facility], Dionne Bucy, MD   8 months ago Morbid obesity [facility])    [place], Dionne Bucy, MD   1 year ago Primary hypertension   [facility], Dionne Bucy, MD

## 2022-06-02 ENCOUNTER — Other Ambulatory Visit: Payer: Self-pay | Admitting: Family Medicine

## 2022-06-02 NOTE — Telephone Encounter (Signed)
Requested medication (s) are due for refill today - yes  Requested medication (s) are on the active medication list -yes  Future visit scheduled -no  Last refill: 03/22/22 #30  Notes to clinic: non delegated Rx  Requested Prescriptions  Pending Prescriptions Disp Refills   cyclobenzaprine (FLEXERIL) 10 MG tablet [Pharmacy Med Name: CYCLOBENZAPRINE 10MG  TABLETS] 30 tablet 0    Sig: TAKE 1 TABLET(10 MG) BY MOUTH THREE TIMES DAILY AS NEEDED FOR MUSCLE SPASMS     Not Delegated - Analgesics:  Muscle Relaxants Failed - 06/02/2022 10:24 AM      Failed - This refill cannot be delegated      Passed - Valid encounter within last 6 months    Recent Outpatient Visits           3 months ago Hypertension associated with diabetes [facility])   [place], [place], DO   4 months ago Elevated LFTs   [facility] Rory Percy M, DO   6 months ago Hypertension associated with diabetes [facility])   [facility], Dionne Bucy, MD   8 months ago Morbid obesity [facility])   [facility], Dionne Bucy, MD   1 year ago Primary hypertension   [facility] [place], Dionne Bucy, MD                 Requested Prescriptions  Pending Prescriptions Disp Refills   cyclobenzaprine (FLEXERIL) 10 MG tablet [Pharmacy Med Name: CYCLOBENZAPRINE 10MG  TABLETS] 30 tablet 0    Sig: TAKE 1 TABLET(10 MG) BY MOUTH THREE TIMES DAILY AS NEEDED FOR MUSCLE SPASMS     Not Delegated - Analgesics:  Muscle Relaxants Failed - 06/02/2022 10:24 AM      Failed - This refill cannot be delegated      Passed - Valid encounter within last 6 months    Recent Outpatient Visits           3 months ago Hypertension associated with diabetes [facility])   [place], [place], DO   4 months ago Elevated LFTs   [facility] Rory Percy M, DO   6 months ago Hypertension associated with diabetes [facility])   [facility], Dionne Bucy, MD   8 months ago Morbid obesity [facility])   [place], Dionne Bucy, MD   1 year ago Primary hypertension   [facility] [place], Dionne Bucy, MD

## 2022-07-19 ENCOUNTER — Other Ambulatory Visit: Payer: Self-pay | Admitting: Family Medicine

## 2022-07-21 ENCOUNTER — Encounter: Payer: Self-pay | Admitting: Family Medicine

## 2022-07-21 MED ORDER — SERTRALINE HCL 100 MG PO TABS: 100.0000 mg | ORAL_TABLET | Freq: Every day | ORAL | 0 refills | Status: DC

## 2022-08-16 ENCOUNTER — Other Ambulatory Visit: Payer: Self-pay

## 2022-08-16 MED ORDER — CYCLOBENZAPRINE HCL 10 MG PO TABS: ORAL_TABLET | ORAL | 0 refills | Status: DC

## 2022-08-16 NOTE — Telephone Encounter (Signed)
Patient of Dr. Jacinto Reap Refill request Cyclobenzaprine 10 mg tablets

## 2022-08-26 ENCOUNTER — Other Ambulatory Visit: Payer: Self-pay | Admitting: Family Medicine

## 2022-10-16 ENCOUNTER — Other Ambulatory Visit: Payer: Self-pay | Admitting: Family Medicine

## 2022-10-17 ENCOUNTER — Telehealth: Payer: Self-pay | Admitting: Family Medicine

## 2022-10-17 MED ORDER — METFORMIN HCL ER 500 MG PO TB24: ORAL_TABLET | ORAL | 0 refills | Status: DC

## 2022-10-17 MED ORDER — LISINOPRIL 20 MG PO TABS: 20.0000 mg | ORAL_TABLET | Freq: Every day | ORAL | 0 refills | Status: DC

## 2022-10-17 NOTE — Telephone Encounter (Signed)
Please refill x30d, but patient needs to make a f/u appt prior to next refill. He is overdue for a follow up appt for chronic disease

## 2022-10-17 NOTE — Telephone Encounter (Signed)
[facility] pharmacy requesting prescription refill lisinopril (ZESTRIL) 20 MG tablet   Please advise

## 2022-10-17 NOTE — Telephone Encounter (Signed)
LOV 01/11/22 NOV not scheuled LRF 11/19/21 #90 1RF Patient inidicated not taking 02/22/22

## 2022-10-17 NOTE — Telephone Encounter (Signed)
[facility] pharmacy faxed refill request for the following medications:    metFORMIN (GLUCOPHAGE-XR) 500 MG 24 hr tablet   Please advise

## 2022-10-17 NOTE — Telephone Encounter (Signed)
LOV 01/11/22 Nov not scheduled  [facility] 04/27/22 #180 0RF

## 2022-10-18 NOTE — Telephone Encounter (Signed)
Requested medication (s) are due for refill today: Due 10/19/22  Requested medication (s) are on the active medication list: yes    Last refill: 07/21/22  #90  0 refills  Future visit scheduled no  Notes to clinic:Pt needs appt, attempted to reach to schedule, left VM to call back.    Requested Prescriptions  Pending Prescriptions Disp Refills   sertraline (ZOLOFT) 100 MG tablet [Pharmacy Med Name: SERTRALINE 100MG  TABLETS] 90 tablet 0    Sig: TAKE 1 TABLET(100 MG) BY MOUTH DAILY     Psychiatry:  Antidepressants - SSRI - sertraline Failed - 10/16/2022 10:31 AM      Failed - ALT in normal range and within 360 days    ALT  Date Value Ref Range Status  01/11/2022 61 (H) 0 - 44 IU/L Final         Failed - Valid encounter within last 6 months    Recent Outpatient Visits           7 months ago Hypertension associated with diabetes [facility])   [facility] [place] M, DO   9 months ago Elevated LFTs   [facility] [place] M, DO   11 months ago Hypertension associated with diabetes [facility])   [facility] [place], Marzella Schlein, MD   1 year ago Morbid obesity [facility])   [place] [facility] [place], Marzella Schlein, MD   1 year ago Primary hypertension   [facility] [place], Marzella Schlein, MD              Passed - AST in normal range and within 360 days    AST  Date Value Ref Range Status  01/11/2022 30 0 - 40 IU/L Final         Passed - Completed PHQ-2 or PHQ-9 in the last 360 days

## 2022-11-01 ENCOUNTER — Other Ambulatory Visit: Payer: Self-pay | Admitting: Family Medicine

## 2022-11-10 LAB — HM DIABETES EYE EXAM

## 2022-11-15 ENCOUNTER — Telehealth: Payer: Self-pay

## 2022-11-15 NOTE — Telephone Encounter (Signed)
Request Reference Number: [id number]. OZEMPIC INJ 2MG /3ML is approved through 11/15/2023. Your patient may now fill this prescription and it will be covered.. Authorization Expiration Date: November 15, 2023.

## 2022-11-15 NOTE — Telephone Encounter (Signed)
-----   Message from Unknown Foley sent at 11/08/2022  2:53 PM EDT ----- Please review incoming fax

## 2022-11-15 NOTE — Telephone Encounter (Signed)
PA started for key [id number]

## 2022-11-24 ENCOUNTER — Ambulatory Visit: Payer: Self-pay | Admitting: Family Medicine

## 2022-11-24 ENCOUNTER — Other Ambulatory Visit: Payer: Self-pay | Admitting: Family Medicine

## 2022-11-24 ENCOUNTER — Encounter: Payer: Self-pay | Admitting: Family Medicine

## 2022-12-09 ENCOUNTER — Encounter: Payer: Self-pay | Admitting: Family Medicine

## 2022-12-09 ENCOUNTER — Ambulatory Visit: Payer: 59 | Admitting: Family Medicine

## 2022-12-09 VITALS — BP 162/101 | HR 93 | Ht 69.0 in | Wt >= 6400 oz

## 2022-12-09 DIAGNOSIS — E1159 Type 2 diabetes mellitus with other circulatory complications: Secondary | ICD-10-CM | POA: Diagnosis not present

## 2022-12-09 DIAGNOSIS — I872 Venous insufficiency (chronic) (peripheral): Secondary | ICD-10-CM

## 2022-12-09 DIAGNOSIS — F3341 Major depressive disorder, recurrent, in partial remission: Secondary | ICD-10-CM

## 2022-12-09 DIAGNOSIS — E1169 Type 2 diabetes mellitus with other specified complication: Secondary | ICD-10-CM | POA: Diagnosis not present

## 2022-12-09 DIAGNOSIS — E785 Hyperlipidemia, unspecified: Secondary | ICD-10-CM

## 2022-12-09 DIAGNOSIS — K219 Gastro-esophageal reflux disease without esophagitis: Secondary | ICD-10-CM

## 2022-12-09 DIAGNOSIS — I152 Hypertension secondary to endocrine disorders: Secondary | ICD-10-CM

## 2022-12-09 DIAGNOSIS — F411 Generalized anxiety disorder: Secondary | ICD-10-CM

## 2022-12-09 MED ORDER — TRIAMCINOLONE ACETONIDE 0.5 % EX OINT: 1.0000 | TOPICAL_OINTMENT | Freq: Two times a day (BID) | CUTANEOUS | 1 refills | Status: DC

## 2022-12-09 MED ORDER — HYDROCHLOROTHIAZIDE 25 MG PO TABS: 25.0000 mg | ORAL_TABLET | Freq: Every day | ORAL | 3 refills | Status: DC

## 2022-12-09 MED ORDER — TIRZEPATIDE 5 MG/0.5ML ~~LOC~~ SOAJ: 5.0000 mg | SUBCUTANEOUS | 0 refills | Status: DC

## 2022-12-09 MED ORDER — SERTRALINE HCL 100 MG PO TABS: 150.0000 mg | ORAL_TABLET | Freq: Every day | ORAL | 1 refills | Status: DC

## 2022-12-09 MED ORDER — MELOXICAM 15 MG PO TABS: 15.0000 mg | ORAL_TABLET | Freq: Every day | ORAL | 1 refills | Status: DC

## 2022-12-09 MED ORDER — CYCLOBENZAPRINE HCL 10 MG PO TABS: ORAL_TABLET | ORAL | 0 refills | Status: DC

## 2022-12-09 MED ORDER — LISINOPRIL 20 MG PO TABS: 20.0000 mg | ORAL_TABLET | Freq: Every day | ORAL | 0 refills | Status: DC

## 2022-12-09 MED ORDER — TIRZEPATIDE 2.5 MG/0.5ML ~~LOC~~ SOAJ: 2.5000 mg | SUBCUTANEOUS | 0 refills | Status: DC

## 2022-12-09 NOTE — Assessment & Plan Note (Signed)
Discussed importance of healthy weight management Discussed diet and exercise  

## 2022-12-09 NOTE — Assessment & Plan Note (Signed)
Patient on sertraline 100mg  daily, reports needing a "boost" as symptoms are improving but not at goal. -Increase sertraline to 150mg  daily.

## 2022-12-09 NOTE — Assessment & Plan Note (Signed)
Patient on omeprazole 20mg  daily. -Continue omeprazole 20mg  daily.

## 2022-12-09 NOTE — Assessment & Plan Note (Signed)
Reviewed last lipid panel Not currently on a statin Reviewed FLP and CMP Discussed diet and exercise  Recheck in 22m

## 2022-12-09 NOTE — Assessment & Plan Note (Signed)
Patient on metformin XR 1000mg  daily, but unable to get insurance approval for [facility]. Patient expressed interest in [place], but was informed it is the same as Ozempic which previously caused stomach upset. -Attempt to get Mounjaro approved with new insurance. -If Greggory Keen is not approved, consider Trulicity. -Continue metformin XR 1000mg  daily until [facility] or alternative is obtained. -Check A1C, kidney and liver function, cholesterol, and urine protein today.

## 2022-12-09 NOTE — Progress Notes (Signed)
Established Patient Office Visit  Subjective   Patient ID: Jeremy David, male    DOB: [birth year]  Age: 32 y.o. MRN: [id number]  Chief Complaint  Patient presents with   Medical Management of Chronic Issues    Patient is being seen fo chronic care follow up and taking lisinopril, metformin and sertraline as prescribed with no s/e.Reports lower leg edema. Patient does not check blood sugar at home or blood pressure. Patient reports he would also like for legs to be looked at due to rash on lower legs that has expanded. Rash first noticed about 3 months ago and has not used any form of treatment.    HPI  Discussed the use of AI scribe software for clinical note transcription with the patient, who gave verbal consent to proceed.  History of Present Illness   The patient, a 32 year old male with a history of T2DM, HTN, HLD, MDD, GAD, morbid obesity, and GERD, presents for a follow-up visit. The patient's blood pressure was elevated at his last visit in September 2023, but no changes were made to his medication regimen. The patient reports that he has been taking his prescribed medications, except for hydrochlorothiazide, which he stopped taking for an unknown reason. The patient's GERD symptoms have worsened since he stopped taking Prilosec, and he has been gaining weight. The patient also reports that he has been experiencing itching on his shins for the past three to four months. The patient is interested in trying [facility] for weight loss, as he has heard from a coworker that it was approved for him. The patient also expresses a desire for a "boost" in his sertraline dosage, as he feels it is not quite enough for his mood and anxiety.        ROS    Objective:     BP (!) 162/101   Pulse 93   Ht 5\' 9"  (1.753 m)   Wt (!) 473 lb 4.8 oz (214.7 kg)   BMI 69.89 kg/m    Physical Exam   No results found for any visits on 12/09/22.    The ASCVD Risk score (Arnett DK, et al., 2019) failed  to calculate for the following reasons:   The 2019 ASCVD risk score is only valid for ages 28 to 20    Assessment & Plan:   Problem List Items Addressed This Visit       Cardiovascular and Mediastinum   Hypertension associated with diabetes (HCC) - Primary    Elevated blood pressure in the office, patient had not taken his medications prior to the visit. Patient reported discontinuing hydrochlorothiazide without clear reason. -Resume hydrochlorothiazide 25mg  daily in addition to lisinopril 20mg  daily. -Check blood pressure at home. -Follow-up in 1 month to reassess blood pressure control.      Relevant Medications   lisinopril (ZESTRIL) 20 MG tablet   hydrochlorothiazide (HYDRODIURIL) 25 MG tablet   tirzepatide (MOUNJARO) 2.5 MG/0.5ML Pen   tirzepatide (MOUNJARO) 5 MG/0.5ML Pen   Other Relevant Orders   Comprehensive metabolic panel   Venous stasis dermatitis of both lower extremities    Patient reports itchy rash on shins for the past 3-4 months. -Prescribe triamcinolone ointment, apply twice daily. -Encourage use of compression socks to reduce leg swelling.      Relevant Medications   lisinopril (ZESTRIL) 20 MG tablet   hydrochlorothiazide (HYDRODIURIL) 25 MG tablet     Digestive   GERD (gastroesophageal reflux disease)    Patient on omeprazole 20mg  daily. -Continue omeprazole 20mg   daily.        Endocrine   T2DM (type 2 diabetes mellitus) (HCC)    Patient on metformin XR 1000mg  daily, but unable to get insurance approval for [facility]. Patient expressed interest in [place], but was informed it is the same as Ozempic which previously caused stomach upset. -Attempt to get Mounjaro approved with new insurance. -If Greggory Keen is not approved, consider Trulicity. -Continue metformin XR 1000mg  daily until [facility] or alternative is obtained. -Check A1C, kidney and liver function, cholesterol, and urine protein today.      Relevant Medications   lisinopril (ZESTRIL) 20 MG  tablet   tirzepatide (MOUNJARO) 2.5 MG/0.5ML Pen   tirzepatide (MOUNJARO) 5 MG/0.5ML Pen   Other Relevant Orders   HgB A1c   Urine microalbumin-creatinine with uACR   Hyperlipidemia associated with type 2 diabetes mellitus (HCC)    Reviewed last lipid panel Not currently on a statin Reviewed FLP and CMP Discussed diet and exercise  Recheck in 61m      Relevant Medications   lisinopril (ZESTRIL) 20 MG tablet   hydrochlorothiazide (HYDRODIURIL) 25 MG tablet   tirzepatide (MOUNJARO) 2.5 MG/0.5ML Pen   tirzepatide (MOUNJARO) 5 MG/0.5ML Pen   Other Relevant Orders   Comprehensive metabolic panel   Lipid panel     Other   Morbid obesity (HCC)    Discussed importance of healthy weight management Discussed diet and exercise       Relevant Medications   tirzepatide (MOUNJARO) 2.5 MG/0.5ML Pen   tirzepatide (MOUNJARO) 5 MG/0.5ML Pen   MDD (major depressive disorder)    Patient on sertraline 100mg  daily, reports needing a "boost" as symptoms are improving but not at goal. -Increase sertraline to 150mg  daily.      Relevant Medications   sertraline (ZOLOFT) 100 MG tablet   GAD (generalized anxiety disorder)    Patient on sertraline 100mg  daily, reports needing a "boost" as symptoms are improving but not at goal. -Increase sertraline to 150mg  daily.      Relevant Medications   sertraline (ZOLOFT) 100 MG tablet       General Health Maintenance / Followup Plans: -Perform foot exam today. -Check blood pressure at home. -Follow-up in 1 month to reassess blood pressure control. -Check A1C, kidney and liver function, cholesterol, and urine protein today. -Attempt to get Mounjaro approved with new insurance. -If Greggory Keen is not approved, consider Trulicity. -Increase sertraline to 150mg  daily. -Prescribe triamcinolone ointment, apply twice daily. -Encourage use of compression socks to reduce leg swelling.        Return in about 4 weeks (around 01/06/2023) for BP f/u.     Shirlee Latch, MD

## 2022-12-09 NOTE — Assessment & Plan Note (Signed)
Elevated blood pressure in the office, patient had not taken his medications prior to the visit. Patient reported discontinuing hydrochlorothiazide without clear reason. -Resume hydrochlorothiazide 25mg  daily in addition to lisinopril 20mg  daily. -Check blood pressure at home. -Follow-up in 1 month to reassess blood pressure control.

## 2022-12-09 NOTE — Assessment & Plan Note (Signed)
Patient reports itchy rash on shins for the past 3-4 months. -Prescribe triamcinolone ointment, apply twice daily. -Encourage use of compression socks to reduce leg swelling.

## 2022-12-10 LAB — COMPREHENSIVE METABOLIC PANEL
ALT: 89 IU/L — ABNORMAL HIGH (ref 0–44)
AST: 65 IU/L — ABNORMAL HIGH (ref 0–40)
Albumin: 4.3 g/dL (ref 4.1–5.1)
Alkaline Phosphatase: 30 IU/L — ABNORMAL LOW (ref 44–121)
BUN/Creatinine Ratio: 13 (ref 9–20)
BUN: 9 mg/dL (ref 6–20)
Bilirubin Total: 0.9 mg/dL (ref 0.0–1.2)
CO2: 20 mmol/L (ref 20–29)
Calcium: 9 mg/dL (ref 8.7–10.2)
Chloride: 100 mmol/L (ref 96–106)
Creatinine, Ser: 0.72 mg/dL — ABNORMAL LOW (ref 0.76–1.27)
Globulin, Total: 2.5 g/dL (ref 1.5–4.5)
Glucose: 299 mg/dL — ABNORMAL HIGH (ref 70–99)
Potassium: 4.7 mmol/L (ref 3.5–5.2)
Sodium: 136 mmol/L (ref 134–144)
Total Protein: 6.8 g/dL (ref 6.0–8.5)
eGFR: 124 mL/min/{1.73_m2} (ref >59)

## 2022-12-10 LAB — MICROALBUMIN / CREATININE URINE RATIO
Creatinine, Urine: 155.4 mg/dL
Microalb/Creat Ratio: 29 mg/g creat (ref 0–29)
Microalbumin, Urine: 45.3 ug/mL

## 2022-12-10 LAB — LIPID PANEL
Chol/HDL Ratio: 6.7 ratio — ABNORMAL HIGH (ref 0.0–5.0)
Cholesterol, Total: 247 mg/dL — ABNORMAL HIGH (ref 100–199)
HDL: 37 mg/dL — ABNORMAL LOW (ref >39)
LDL Chol Calc (NIH): 182 mg/dL — ABNORMAL HIGH (ref 0–99)
Triglycerides: 151 mg/dL — ABNORMAL HIGH (ref 0–149)
VLDL Cholesterol Cal: 28 mg/dL (ref 5–40)

## 2022-12-10 LAB — HEMOGLOBIN A1C
Est. average glucose Bld gHb Est-mCnc: 252 mg/dL
Hgb A1c MFr Bld: 10.4 % — ABNORMAL HIGH (ref 4.8–5.6)

## 2022-12-13 ENCOUNTER — Ambulatory Visit: Payer: Self-pay | Admitting: Family Medicine

## 2022-12-29 ENCOUNTER — Ambulatory Visit
Admission: RE | Admit: 2022-12-29 | Discharge: 2022-12-29 | Disposition: A | Discharge status: Home / Self Care | Payer: 59 | Source: Ambulatory Visit | Attending: Family Medicine | Admitting: Family Medicine

## 2022-12-29 VITALS — BP 179/71 | HR 113 | Temp 98.9°F | Resp 20

## 2022-12-29 DIAGNOSIS — H66001 Acute suppurative otitis media without spontaneous rupture of ear drum, right ear: Secondary | ICD-10-CM

## 2022-12-29 DIAGNOSIS — H6121 Impacted cerumen, right ear: Secondary | ICD-10-CM

## 2022-12-29 MED ORDER — FLUTICASONE PROPIONATE 50 MCG/ACT NA SUSP: 1.0000 | Freq: Two times a day (BID) | NASAL | 2 refills | Status: DC

## 2022-12-29 MED ORDER — CARBAMIDE PEROXIDE 6.5 % OT SOLN: 5.0000 [drp] | Freq: Two times a day (BID) | OTIC | 0 refills | Status: DC

## 2022-12-29 MED ORDER — AMOXICILLIN 875 MG PO TABS: 875.0000 mg | ORAL_TABLET | Freq: Two times a day (BID) | ORAL | 0 refills | Status: DC

## 2022-12-29 NOTE — ED Triage Notes (Signed)
Pt reports right ear pain and swelling on right side of jaw and sinus drainage x 3 days. Has nasal drainage   States he couldn't even chew his food for breakfast this morning. And difficultly hearing out of right ear.

## 2022-12-29 NOTE — ED Provider Notes (Signed)
[facility]    CSN: [id number] Arrival date & time: 12/29/22  0957      History   Chief Complaint Chief Complaint  Patient presents with   Ear Fullness    Ear pain in the right ear, difficulty hearing out of ear. - Entered by patient    HPI Jeremy David is a 32 y.o. male.   Patient presenting to today with 3-day history of right sharp ear pain, swelling to the side of the jaw, sinus drainage.  Denies fever, chills, drainage, chest pain, shortness of breath, cough.  So far not trying anything over-the-counter for symptoms.    Past Medical History:  Diagnosis Date   Allergic rhinitis    Gallstones    Hypertension     Patient Active Problem List   Diagnosis Date Noted   Hyperlipidemia associated with type 2 diabetes mellitus (HCC) 11/19/2021   Elevated LFTs 11/19/2021   Venous stasis dermatitis of both lower extremities 09/30/2021   GAD (generalized anxiety disorder) 04/08/2021   Esophageal dysphagia 03/08/2021   MDD (major depressive disorder) 03/08/2021   Hypertension associated with diabetes (HCC) 12/07/2017   T2DM (type 2 diabetes mellitus) (HCC) 12/07/2017   Allergic rhinitis 12/07/2017   Gallstones 12/07/2017   GERD (gastroesophageal reflux disease) 12/07/2017   Morbid obesity (HCC) 12/07/2017   Excessive daytime sleepiness 12/07/2017    Past Surgical History:  Procedure Laterality Date   NASAL POLYP EXCISION         Home Medications    Prior to Admission medications   Medication Sig Start Date End Date Taking? Authorizing Provider  amoxicillin (AMOXIL) 875 MG tablet Take 1 tablet (875 mg total) by mouth 2 (two) times daily. 12/29/22  Yes Particia Nearing, PA-C  carbamide peroxide (DEBROX) 6.5 % OTIC solution Place 5 drops into the right ear 2 (two) times daily. 12/29/22  Yes Particia Nearing, PA-C  fluticasone [facility]) 50 MCG/ACT nasal spray Place 1 spray into both nostrils 2 (two) times daily. 12/29/22  Yes Particia Nearing,  PA-C  cyclobenzaprine (FLEXERIL) 10 MG tablet TAKE 1 TABLET(10 MG) BY MOUTH THREE TIMES DAILY AS NEEDED FOR MUSCLE SPASMS 12/09/22   Erasmo Downer, MD  fluticasone (FLONASE) 50 MCG/ACT nasal spray Place 2 sprays into both nostrils daily. 03/08/21   Erasmo Downer, MD  hydrochlorothiazide (HYDRODIURIL) 25 MG tablet Take 1 tablet (25 mg total) by mouth daily. 12/09/22   Erasmo Downer, MD  lisinopril (ZESTRIL) 20 MG tablet Take 1 tablet (20 mg total) by mouth daily. 12/09/22   Erasmo Downer, MD  meloxicam (MOBIC) 15 MG tablet Take 1 tablet (15 mg total) by mouth daily. 12/09/22   Erasmo Downer, MD  metFORMIN (GLUCOPHAGE-XR) 500 MG 24 hr tablet TAKE 2 TABLETS(1000 MG) BY MOUTH DAILY WITH BREAKFAST 11/24/22   Bacigalupo, Marzella Schlein, MD  omeprazole (PRILOSEC) 20 MG capsule Take 1 capsule (20 mg total) by mouth daily. 02/22/22   Caro Laroche, DO  sertraline (ZOLOFT) 100 MG tablet Take 1.5 tablets (150 mg total) by mouth daily. 12/09/22   Erasmo Downer, MD  tirzepatide [facility]) 2.5 MG/0.5ML Pen Inject 2.5 mg into the skin once a week. 12/09/22   Erasmo Downer, MD  tirzepatide [facility]) 5 MG/0.5ML Pen Inject 5 mg into the skin once a week. 12/09/22   Erasmo Downer, MD  triamcinolone ointment (KENALOG) 0.5 % Apply 1 Application topically 2 (two) times daily. 12/09/22   Erasmo Downer, MD  Family History Family History  Problem Relation Age of Onset   Diabetes Mother    Hypertension Father    Breast cancer Maternal Aunt    Cancer Maternal Grandmother        unknown cancer type, thought to be related to being a smoker   Colon cancer Neg Hx    Prostate cancer Neg Hx     Social History Social History   Tobacco Use   Smoking status: Never   Smokeless tobacco: Never  Vaping Use   Vaping status: Never Used  Substance Use Topics   Alcohol use: Yes    Alcohol/week: 1.0 standard drink of alcohol    Types: 1 Standard drinks or equivalent per  week   Drug use: Never     Allergies   Bee venom, Peanut-containing drug products, Molds & smuts, and Pollen extract   Review of Systems Review of Systems Per HPI  Physical Exam Triage Vital Signs ED Triage Vitals  Encounter Vitals Group     BP 12/29/22 1006 (!) 179/71     Systolic BP Percentile --      Diastolic BP Percentile --      Pulse Rate 12/29/22 1006 (!) 113     Resp 12/29/22 1006 20     Temp 12/29/22 1006 98.9 F (37.2 C)     Temp Source 12/29/22 1006 Oral     SpO2 12/29/22 1006 93 %     Weight --      Height --      Head Circumference --      Peak Flow --      Pain Score 12/29/22 1007 5     Pain Loc --      Pain Education --      Exclude from Growth Chart --    No data found.  Updated Vital Signs BP (!) 179/71 (BP Location: Right Wrist)   Pulse (!) 113   Temp 98.9 F (37.2 C) (Oral)   Resp 20   SpO2 93%   Visual Acuity Right Eye Distance:   Left Eye Distance:   Bilateral Distance:    Right Eye Near:   Left Eye Near:    Bilateral Near:     Physical Exam Vitals and nursing note reviewed.  Constitutional:      Appearance: He is well-developed.  HENT:     Head: Atraumatic.     Right Ear: There is impacted cerumen.     Left Ear: External ear normal.     Ears:     Comments: Unable to visualize TM beneath impaction    Nose: Rhinorrhea present.     Mouth/Throat:     Mouth: Mucous membranes are moist.     Pharynx: Oropharynx is clear. No oropharyngeal exudate or posterior oropharyngeal erythema.  Eyes:     Conjunctiva/sclera: Conjunctivae normal.     Pupils: Pupils are equal, round, and reactive to light.  Cardiovascular:     Rate and Rhythm: Normal rate and regular rhythm.  Pulmonary:     Effort: Pulmonary effort is normal. No respiratory distress.     Breath sounds: No wheezing or rales.  Musculoskeletal:        General: Normal range of motion.     Cervical back: Normal range of motion and neck supple.  Lymphadenopathy:     Cervical:  No cervical adenopathy.  Skin:    General: Skin is warm and dry.  Neurological:     Mental Status: He is alert and oriented  to person, place, and time.  Psychiatric:        Behavior: Behavior normal.      UC Treatments / Results  Labs (all labs ordered are listed, but only abnormal results are displayed) Labs Reviewed - No data to display  EKG   Radiology No results found.  Procedures Procedures (including critical care time)  Medications Ordered in UC Medications - No data to display  Initial Impression / Assessment and Plan / UC Course  I have reviewed the triage vital signs and the nursing notes.  Pertinent labs & imaging results that were available during my care of the patient were reviewed by me and considered in my medical decision making (see chart for details).     Given severity of pain, suspected underlying right ear infection below the impaction of wax.  Unable to visualize to confirm today.  Will treat with Amoxil, Flonase, Debrox drops, warm water lavage at home once pain improves.  Return for worsening symptoms.  Final Clinical Impressions(s) / UC Diagnoses   Final diagnoses:  Acute suppurative otitis media of right ear without spontaneous rupture of tympanic membrane, recurrence not specified  Impacted cerumen of right ear   Discharge Instructions   None    ED Prescriptions     Medication Sig Dispense Auth. Provider   amoxicillin (AMOXIL) 875 MG tablet Take 1 tablet (875 mg total) by mouth 2 (two) times daily. 20 tablet Particia Nearing, PA-C   fluticasone [facility]) 50 MCG/ACT nasal spray Place 1 spray into both nostrils 2 (two) times daily. 16 g Particia Nearing, PA-C   carbamide peroxide (DEBROX) 6.5 % OTIC solution Place 5 drops into the right ear 2 (two) times daily. 15 mL Particia Nearing, [place]      PDMP not reviewed this encounter.   Particia Nearing, [place] 12/29/22 [phone]

## 2023-03-27 ENCOUNTER — Telehealth: Payer: Self-pay | Admitting: Family Medicine

## 2023-03-27 MED ORDER — LISINOPRIL 20 MG PO TABS: 20.0000 mg | ORAL_TABLET | Freq: Every day | ORAL | 0 refills | Status: DC

## 2023-03-27 NOTE — Telephone Encounter (Signed)
[facility] faxed refill request for the following medications: ? ?lisinopril (ZESTRIL) 20 MG tablet  ? ?Please advise. ? ?

## 2023-03-27 NOTE — Telephone Encounter (Signed)
refilled 

## 2023-04-17 ENCOUNTER — Other Ambulatory Visit: Payer: Self-pay | Admitting: Family Medicine

## 2023-04-18 NOTE — Telephone Encounter (Signed)
Requested medication (s) are due for refill today: yes   Requested medication (s) are on the active medication list: yes   Last refill:  12/09/22 #2 ml 0 refills  Future visit scheduled: no  Notes to clinic:  medication not assigned to a protocol. Do you want to refill Rx?     Requested Prescriptions  Pending Prescriptions Disp Refills   MOUNJARO 2.5 MG/0.5ML Pen [Pharmacy Med Name: Greggory Keen 2.5MG /0.5ML INJ ( 4 PENS)] 2 mL 0    Sig: ADMINISTER 2.5 MG UNDER THE SKIN 1 TIME A WEEK     Off-Protocol Failed - 04/17/2023  6:07 PM      Failed - Medication not assigned to a protocol, review manually.      Passed - Valid encounter within last 12 months    Recent Outpatient Visits           4 months ago Hypertension associated with diabetes [facility])   [place] [facility] [place], Marzella Schlein, MD   1 year ago Hypertension associated with diabetes [facility])   [facility] Caro Laroche, DO   1 year ago Elevated LFTs   [place] [facility] Caro Laroche, DO   1 year ago Hypertension associated with diabetes [facility])   [facility] [place], Marzella Schlein, MD   1 year ago Morbid obesity [facility])    [facility], Marzella Schlein, MD

## 2023-04-24 ENCOUNTER — Other Ambulatory Visit: Payer: Self-pay | Admitting: Family Medicine

## 2023-04-24 MED ORDER — TIRZEPATIDE 5 MG/0.5ML ~~LOC~~ SOAJ: 5.0000 mg | SUBCUTANEOUS | 0 refills | Status: DC

## 2023-04-29 ENCOUNTER — Other Ambulatory Visit: Payer: Self-pay | Admitting: Family Medicine

## 2023-05-27 ENCOUNTER — Other Ambulatory Visit: Payer: Self-pay | Admitting: Family Medicine

## 2023-05-30 ENCOUNTER — Other Ambulatory Visit: Payer: Self-pay | Admitting: Family Medicine

## 2023-06-08 ENCOUNTER — Ambulatory Visit: Payer: 59 | Admitting: Family Medicine

## 2023-06-08 ENCOUNTER — Encounter: Payer: Self-pay | Admitting: Family Medicine

## 2023-06-08 VITALS — BP 130/60 | HR 95 | Ht 69.0 in | Wt >= 6400 oz

## 2023-06-08 DIAGNOSIS — M7632 Iliotibial band syndrome, left leg: Secondary | ICD-10-CM | POA: Diagnosis not present

## 2023-06-08 DIAGNOSIS — I152 Hypertension secondary to endocrine disorders: Secondary | ICD-10-CM

## 2023-06-08 DIAGNOSIS — E1159 Type 2 diabetes mellitus with other circulatory complications: Secondary | ICD-10-CM

## 2023-06-08 DIAGNOSIS — E1169 Type 2 diabetes mellitus with other specified complication: Secondary | ICD-10-CM

## 2023-06-08 DIAGNOSIS — M7062 Trochanteric bursitis, left hip: Secondary | ICD-10-CM | POA: Diagnosis not present

## 2023-06-08 DIAGNOSIS — Z23 Encounter for immunization: Secondary | ICD-10-CM

## 2023-06-08 DIAGNOSIS — E785 Hyperlipidemia, unspecified: Secondary | ICD-10-CM

## 2023-06-08 MED ORDER — LISINOPRIL 20 MG PO TABS: 20.0000 mg | ORAL_TABLET | Freq: Every day | ORAL | 1 refills | Status: DC

## 2023-06-08 MED ORDER — TIRZEPATIDE 5 MG/0.5ML ~~LOC~~ SOAJ: 5.0000 mg | SUBCUTANEOUS | 2 refills | Status: DC

## 2023-06-08 MED ORDER — METHYLPREDNISOLONE ACETATE 80 MG/ML IJ SUSP: 80.0000 mg | Freq: Once | INTRAMUSCULAR | Status: DC

## 2023-06-08 MED ORDER — LIDOCAINE HCL 1 % IJ SOLN
10.0000 mL | Freq: Once | INTRAMUSCULAR | Status: AC
  Administered 2023-06-08: 4 mL via INTRADERMAL

## 2023-06-08 MED ORDER — METFORMIN HCL ER 500 MG PO TB24: ORAL_TABLET | ORAL | 1 refills | Status: DC

## 2023-06-08 MED ORDER — METHYLPREDNISOLONE ACETATE 80 MG/ML IJ SUSP
80.0000 mg | Freq: Once | INTRAMUSCULAR | Status: AC
  Administered 2023-06-08: 80 mg via INTRA_ARTICULAR

## 2023-06-08 NOTE — Progress Notes (Signed)
 Established patient visit   Patient: Jeremy David   DOB: [birth year]   33 y.o. Male  MRN: [id number] Visit Date: 06/08/2023  Today's healthcare provider: Jon Eva, MD   Chief Complaint  Patient presents with   Hip Pain    Left hip pain X 2 weeks. Starting to cause trouble with sitting down. Patient reports taking meloxicam  but it was not assisting with pain   Knee Pain    Left knee pain starting a month and a half ago. Meloxicam  and tylenol taken but neither assisted with pain.    Immunizations    Pneumonia vaccine declined Influenza Vaccine - ok to order   Subjective    Hip Pain   Knee Pain    HPI     Hip Pain    Additional comments: Left hip pain X 2 weeks. Starting to cause trouble with sitting down. Patient reports taking meloxicam  but it was not assisting with pain        Knee Pain    Additional comments: Left knee pain starting a month and a half ago. Meloxicam  and tylenol taken but neither assisted with pain.         Immunizations    Additional comments: Pneumonia vaccine declined Influenza Vaccine - ok to order      Last edited by Lilian Fitzpatrick, CMA on 06/08/2023  3:08 PM.       Discussed the use of AI scribe software for clinical note transcription with the patient, who gave verbal consent to proceed.  History of Present Illness   The patient, with a history of hyperlipidemia and diabetes, presents with severe pain in the left hip and knee. The pain, which started in the knee about a month ago, has now moved up to the hip. The pain is severe, with the patient rating it between 7 to 9 on a scale of 10. The pain is worse when moving, standing up, or sitting down, but is manageable when standing still. The patient has tried several medications including meloxicam , Tylenol, Aleve, and ibuprofen, but these have not provided significant relief. The patient has been adherent to his current medications for hyperlipidemia and diabetes, which include  lisinopril , hydrochlorothiazide , and metformin .         Medications: Outpatient Medications Prior to Visit  Medication Sig   cyclobenzaprine  (FLEXERIL ) 10 MG tablet TAKE 1 TABLET(10 MG) BY MOUTH THREE TIMES DAILY AS NEEDED FOR MUSCLE SPASMS   fluticasone  (FLONASE ) 50 MCG/ACT nasal spray Place 1 spray into both nostrils 2 (two) times daily.   hydrochlorothiazide  (HYDRODIURIL ) 25 MG tablet Take 1 tablet (25 mg total) by mouth daily.   meloxicam  (MOBIC ) 15 MG tablet Take 1 tablet (15 mg total) by mouth daily.   omeprazole  (PRILOSEC) 20 MG capsule TAKE 1 CAPSULE(20 MG) BY MOUTH DAILY   sertraline  (ZOLOFT ) 100 MG tablet Take 1.5 tablets (150 mg total) by mouth daily.   [DISCONTINUED] amoxicillin  (AMOXIL ) 875 MG tablet Take 1 tablet (875 mg total) by mouth 2 (two) times daily.   [DISCONTINUED] carbamide peroxide (DEBROX) 6.5 % OTIC solution Place 5 drops into the right ear 2 (two) times daily.   [DISCONTINUED] fluticasone  (FLONASE ) 50 MCG/ACT nasal spray Place 2 sprays into both nostrils daily.   [DISCONTINUED] lisinopril  (ZESTRIL ) 20 MG tablet TAKE 1 TABLET(20 MG) BY MOUTH DAILY   [DISCONTINUED] metFORMIN  (GLUCOPHAGE -XR) 500 MG 24 hr tablet TAKE 2 TABLETS(1000 MG) BY MOUTH DAILY WITH BREAKFAST   [DISCONTINUED] tirzepatide  (MOUNJARO ) 5 MG/0.5ML Pen Inject  5 mg into the skin once a week.   [DISCONTINUED] triamcinolone  ointment (KENALOG ) 0.5 % Apply 1 Application topically 2 (two) times daily.   No facility-administered medications prior to visit.    Review of Systems     Objective    BP 130/60 (BP Location: Right Arm, Patient Position: Sitting, Cuff Size: Large)   Pulse 95   Ht 5' 9 (1.753 m)   Wt (!) 461 lb (209.1 kg)   SpO2 98%   BMI 68.08 kg/m    Physical Exam Vitals reviewed.  Constitutional:      General: He is not in acute distress.    Appearance: Normal appearance. He is not diaphoretic.  HENT:     Head: Normocephalic and atraumatic.  Eyes:     General: No scleral  icterus.    Conjunctiva/sclera: Conjunctivae normal.  Cardiovascular:     Rate and Rhythm: Normal rate and regular rhythm.     Heart sounds: Normal heart sounds. No murmur heard. Pulmonary:     Effort: Pulmonary effort is normal. No respiratory distress.     Breath sounds: Normal breath sounds. No wheezing or rhonchi.  Musculoskeletal:     Cervical back: Neck supple.     Right lower leg: No edema.     Left lower leg: No edema.  Lymphadenopathy:     Cervical: No cervical adenopathy.  Skin:    General: Skin is warm and dry.     Findings: No rash.  Neurological:     Mental Status: He is alert and oriented to person, place, and time. Mental status is at baseline.  Psychiatric:        Mood and Affect: Mood normal.        Behavior: Behavior normal.      Physical Exam   MUSCULOSKELETAL: Tenderness upon palpation of the lateral aspect of the knee and lateral aspect of the hip. Pain upon crossing the leg towards the center, indicative of tension in the iliotibial band.       No results found for any visits on 06/08/23.  Assessment & Plan     Problem List Items Addressed This Visit       Cardiovascular and Mediastinum   Hypertension associated with diabetes (HCC) - Primary   Blood pressure well-controlled with lisinopril  20 mg and hydrochlorothiazide  25 mg. - Refill lisinopril  20 mg - Refill hydrochlorothiazide  25 mg      Relevant Medications   lisinopril  (ZESTRIL ) 20 MG tablet   metFORMIN  (GLUCOPHAGE -XR) 500 MG 24 hr tablet   tirzepatide  (MOUNJARO ) 5 MG/0.5ML Pen   Other Relevant Orders   Comprehensive metabolic panel     Endocrine   T2DM (type 2 diabetes mellitus) (HCC)   Last A1c 10.4 (6 months ago). Patient compliant with metformin  and Mounjaro . Emphasized need to recheck A1c, cholesterol, kidney, and liver function. - Order A1c, cholesterol, kidney, and liver function tests - Refill metformin  1000 mg XR - Refill Mounjaro  5 mg      Relevant Medications    lisinopril  (ZESTRIL ) 20 MG tablet   metFORMIN  (GLUCOPHAGE -XR) 500 MG 24 hr tablet   tirzepatide  (MOUNJARO ) 5 MG/0.5ML Pen   Other Relevant Orders   Hemoglobin A1c   Hyperlipidemia associated with type 2 diabetes mellitus (HCC)   Reviewed last lipid panel Not currently on a statin Reviewed FLP and CMP Discussed diet and exercise  Recheck in 72m      Relevant Medications   lisinopril  (ZESTRIL ) 20 MG tablet   metFORMIN  (GLUCOPHAGE -XR) 500 MG 24  hr tablet   tirzepatide  (MOUNJARO ) 5 MG/0.5ML Pen   Other Relevant Orders   Comprehensive metabolic panel   Lipid panel   Other Visit Diagnoses       Iliotibial band syndrome of left side         Trochanteric bursitis of left hip       Relevant Medications   lidocaine  (XYLOCAINE ) 1 % (with pres) injection 10 mL (Completed)   methylPREDNISolone  acetate (DEPO-MEDROL ) injection 80 mg (Completed)       Assessment and Plan    Iliotibial Band Syndrome with Trochanteric Bursitis Chronic left knee and hip pain (7-9/10), exacerbated by movement. Physical exam shows tenderness along the IT band and bursa. Conservative treatments (meloxicam , Tylenol, Aleve, ibuprofen) ineffective. Discussed anatomy, function, and treatment options including steroid injection (risks: bleeding, infection, steroid flare) and home exercises. Patient opted for home exercises and steroid injection. - Administer steroid injection to left hip bursa - Provide IT band stretching exercises via MyChart  General Health Maintenance Administered flu shot during visit. - Administer flu shot.       INJECTION: Patient was given informed consent,. Appropriate time out was taken. Area prepped and draped in usual sterile fashion. 1 cc of depo-medrol  80 mg/ml plus  4 cc of 1% lidocaine  without epinephrine was injected into the L greater trochanteric bursa using a(n) lateral approach. The patient tolerated the procedure well. There were no complications. Post procedure instructions  were given.      Return in about 3 months (around 09/06/2023) for chronic disease f/u.       Jon Eva, MD  [facility] [phone] (phone) [phone] (fax)  [facility]

## 2023-06-08 NOTE — Assessment & Plan Note (Signed)
Reviewed last lipid panel Not currently on a statin Reviewed FLP and CMP Discussed diet and exercise  Recheck in 22m

## 2023-06-08 NOTE — Assessment & Plan Note (Signed)
 Last A1c 10.4 (6 months ago). Patient compliant with metformin  and Mounjaro . Emphasized need to recheck A1c, cholesterol, kidney, and liver function. - Order A1c, cholesterol, kidney, and liver function tests - Refill metformin  1000 mg XR - Refill Mounjaro  5 mg

## 2023-06-08 NOTE — Assessment & Plan Note (Signed)
 Blood pressure well-controlled with lisinopril 20 mg and hydrochlorothiazide 25 mg. - Refill lisinopril 20 mg - Refill hydrochlorothiazide 25 mg

## 2023-06-15 ENCOUNTER — Ambulatory Visit: Payer: 59 | Admitting: Family Medicine

## 2023-06-28 LAB — HM DIABETES EYE EXAM

## 2023-07-18 ENCOUNTER — Other Ambulatory Visit: Payer: Self-pay | Admitting: Family Medicine

## 2023-07-19 NOTE — Telephone Encounter (Signed)
Requested Prescriptions  Pending Prescriptions Disp Refills   sertraline (ZOLOFT) 100 MG tablet [Pharmacy Med Name: SERTRALINE 100MG  TABLETS] 135 tablet 0    Sig: TAKE 1 AND 1/2 TABLETS(150 MG) BY MOUTH DAILY     Psychiatry:  Antidepressants - SSRI - sertraline Failed - 07/19/2023 10:48 AM      Failed - AST in normal range and within 360 days    AST  Date Value Ref Range Status  12/09/2022 65 (H) 0 - 40 IU/L Final         Failed - ALT in normal range and within 360 days    ALT  Date Value Ref Range Status  12/09/2022 89 (H) 0 - 44 IU/L Final         Passed - Completed PHQ-2 or PHQ-9 in the last 360 days      Passed - Valid encounter within last 6 months    Recent Outpatient Visits           1 month ago Hypertension associated with diabetes [facility])   [place] [facility] [place], Marzella Schlein, MD   7 months ago Hypertension associated with diabetes [facility])   [place] [facility] [place], Marzella Schlein, MD   1 year ago Hypertension associated with diabetes [facility])   [place] [facility] Caro Laroche, DO   1 year ago Elevated LFTs   [place] [facility] Caro Laroche, DO   1 year ago Hypertension associated with diabetes [facility])   [place] [facility] Bacigalupo, Marzella Schlein, MD       Future Appointments             In 1 month Bacigalupo, Marzella Schlein, MD [facility]

## 2023-08-25 ENCOUNTER — Encounter: Payer: Self-pay | Admitting: Family Medicine

## 2023-08-25 DIAGNOSIS — J301 Allergic rhinitis due to pollen: Secondary | ICD-10-CM

## 2023-08-25 DIAGNOSIS — E1159 Type 2 diabetes mellitus with other circulatory complications: Secondary | ICD-10-CM

## 2023-08-25 DIAGNOSIS — E1169 Type 2 diabetes mellitus with other specified complication: Secondary | ICD-10-CM

## 2023-08-28 NOTE — Telephone Encounter (Signed)
 Ok to re-place referrals (see previous diagnoses)

## 2023-09-07 ENCOUNTER — Ambulatory Visit: Payer: Self-pay | Admitting: Family Medicine

## 2023-09-07 ENCOUNTER — Other Ambulatory Visit: Payer: Self-pay | Admitting: Family Medicine

## 2023-09-07 DIAGNOSIS — E1169 Type 2 diabetes mellitus with other specified complication: Secondary | ICD-10-CM

## 2023-09-08 LAB — COMPREHENSIVE METABOLIC PANEL WITH GFR
ALT: 76 IU/L — ABNORMAL HIGH (ref 0–44)
AST: 54 IU/L — ABNORMAL HIGH (ref 0–40)
Albumin: 4.2 g/dL (ref 4.1–5.1)
Alkaline Phosphatase: 26 IU/L — ABNORMAL LOW (ref 44–121)
BUN/Creatinine Ratio: 14 (ref 9–20)
BUN: 10 mg/dL (ref 6–20)
Bilirubin Total: 1.3 mg/dL — ABNORMAL HIGH (ref 0.0–1.2)
CO2: 21 mmol/L (ref 20–29)
Calcium: 9.3 mg/dL (ref 8.7–10.2)
Chloride: 98 mmol/L (ref 96–106)
Creatinine, Ser: 0.72 mg/dL — ABNORMAL LOW (ref 0.76–1.27)
Globulin, Total: 2.7 g/dL (ref 1.5–4.5)
Glucose: 215 mg/dL — ABNORMAL HIGH (ref 70–99)
Potassium: 4.4 mmol/L (ref 3.5–5.2)
Sodium: 137 mmol/L (ref 134–144)
Total Protein: 6.9 g/dL (ref 6.0–8.5)
eGFR: 123 mL/min/{1.73_m2} (ref >59)

## 2023-09-08 LAB — LIPID PANEL
Chol/HDL Ratio: 6.1 ratio — ABNORMAL HIGH (ref 0.0–5.0)
Cholesterol, Total: 242 mg/dL — ABNORMAL HIGH (ref 100–199)
HDL: 40 mg/dL (ref >39)
LDL Chol Calc (NIH): 177 mg/dL — ABNORMAL HIGH (ref 0–99)
Triglycerides: 137 mg/dL (ref 0–149)
VLDL Cholesterol Cal: 25 mg/dL (ref 5–40)

## 2023-09-08 LAB — HEMOGLOBIN A1C
Est. average glucose Bld gHb Est-mCnc: 214 mg/dL
Hgb A1c MFr Bld: 9.1 % — ABNORMAL HIGH (ref 4.8–5.6)

## 2023-09-08 LAB — MICROALBUMIN / CREATININE URINE RATIO
Creatinine, Urine: 131.8 mg/dL
Microalb/Creat Ratio: 14 mg/g{creat} (ref 0–29)
Microalbumin, Urine: 18.4 ug/mL

## 2023-09-11 ENCOUNTER — Encounter: Payer: Self-pay | Admitting: Family Medicine

## 2023-09-12 ENCOUNTER — Ambulatory Visit: Admission: EM | Admit: 2023-09-12 | Discharge: 2023-09-12 | Disposition: A | Discharge status: Home / Self Care

## 2023-09-12 ENCOUNTER — Emergency Department (HOSPITAL_COMMUNITY)

## 2023-09-12 ENCOUNTER — Emergency Department (HOSPITAL_COMMUNITY)
Admission: EM | Admit: 2023-09-12 | Discharge: 2023-09-12 | Disposition: A | Discharge status: Home / Self Care | Attending: Emergency Medicine | Admitting: Emergency Medicine

## 2023-09-12 ENCOUNTER — Other Ambulatory Visit: Payer: Self-pay

## 2023-09-12 ENCOUNTER — Encounter (HOSPITAL_COMMUNITY): Payer: Self-pay | Admitting: Emergency Medicine

## 2023-09-12 ENCOUNTER — Encounter: Payer: Self-pay | Admitting: Emergency Medicine

## 2023-09-12 DIAGNOSIS — Z91010 Allergy to peanuts: Secondary | ICD-10-CM | POA: Insufficient documentation

## 2023-09-12 DIAGNOSIS — Z79899 Other long term (current) drug therapy: Secondary | ICD-10-CM | POA: Diagnosis not present

## 2023-09-12 DIAGNOSIS — S4992XA Unspecified injury of left shoulder and upper arm, initial encounter: Secondary | ICD-10-CM

## 2023-09-12 DIAGNOSIS — M7989 Other specified soft tissue disorders: Secondary | ICD-10-CM

## 2023-09-12 DIAGNOSIS — I10 Essential (primary) hypertension: Secondary | ICD-10-CM | POA: Diagnosis not present

## 2023-09-12 DIAGNOSIS — Z794 Long term (current) use of insulin: Secondary | ICD-10-CM | POA: Diagnosis not present

## 2023-09-12 DIAGNOSIS — M79671 Pain in right foot: Secondary | ICD-10-CM | POA: Insufficient documentation

## 2023-09-12 DIAGNOSIS — W228XXA Striking against or struck by other objects, initial encounter: Secondary | ICD-10-CM | POA: Insufficient documentation

## 2023-09-12 DIAGNOSIS — Z7984 Long term (current) use of oral hypoglycemic drugs: Secondary | ICD-10-CM | POA: Insufficient documentation

## 2023-09-12 DIAGNOSIS — E119 Type 2 diabetes mellitus without complications: Secondary | ICD-10-CM | POA: Insufficient documentation

## 2023-09-12 DIAGNOSIS — S5012XA Contusion of left forearm, initial encounter: Secondary | ICD-10-CM | POA: Diagnosis not present

## 2023-09-12 DIAGNOSIS — T148XXA Other injury of unspecified body region, initial encounter: Secondary | ICD-10-CM

## 2023-09-12 DIAGNOSIS — S59912A Unspecified injury of left forearm, initial encounter: Secondary | ICD-10-CM | POA: Diagnosis present

## 2023-09-12 LAB — CBC WITH DIFFERENTIAL/PLATELET
Abs Immature Granulocytes: 0.07 10*3/uL (ref 0.00–0.07)
Basophils Absolute: 0 10*3/uL (ref 0.0–0.1)
Basophils Relative: 1 %
Eosinophils Absolute: 0.1 10*3/uL (ref 0.0–0.5)
Eosinophils Relative: 2 %
HCT: 43.8 % (ref 39.0–52.0)
Hemoglobin: 14.6 g/dL (ref 13.0–17.0)
Immature Granulocytes: 1 %
Lymphocytes Relative: 23 %
Lymphs Abs: 1.9 10*3/uL (ref 0.7–4.0)
MCH: 31 pg (ref 26.0–34.0)
MCHC: 33.3 g/dL (ref 30.0–36.0)
MCV: 93 fL (ref 80.0–100.0)
Monocytes Absolute: 0.6 10*3/uL (ref 0.1–1.0)
Monocytes Relative: 7 %
Neutro Abs: 5.6 10*3/uL (ref 1.7–7.7)
Neutrophils Relative %: 66 %
Platelets: 207 10*3/uL (ref 150–400)
RBC: 4.71 MIL/uL (ref 4.22–5.81)
RDW: 13.1 % (ref 11.5–15.5)
WBC: 8.4 10*3/uL (ref 4.0–10.5)
nRBC: 0 % (ref 0.0–0.2)

## 2023-09-12 LAB — COMPREHENSIVE METABOLIC PANEL WITH GFR
ALT: 80 U/L — ABNORMAL HIGH (ref 0–44)
AST: 65 U/L — ABNORMAL HIGH (ref 15–41)
Albumin: 3.8 g/dL (ref 3.5–5.0)
Alkaline Phosphatase: 20 U/L — ABNORMAL LOW (ref 38–126)
Anion gap: 11 (ref 5–15)
BUN: 12 mg/dL (ref 6–20)
CO2: 22 mmol/L (ref 22–32)
Calcium: 9 mg/dL (ref 8.9–10.3)
Chloride: 102 mmol/L (ref 98–111)
Creatinine, Ser: 0.67 mg/dL (ref 0.61–1.24)
GFR, Estimated: >60 mL/min (ref >60)
Glucose, Bld: 158 mg/dL — ABNORMAL HIGH (ref 70–99)
Potassium: 3.7 mmol/L (ref 3.5–5.1)
Sodium: 135 mmol/L (ref 135–145)
Total Bilirubin: 1.3 mg/dL — ABNORMAL HIGH (ref 0.0–1.2)
Total Protein: 7.1 g/dL (ref 6.5–8.1)

## 2023-09-12 LAB — LACTIC ACID, PLASMA: Lactic Acid, Venous: 1.7 mmol/L (ref 0.5–1.9)

## 2023-09-12 LAB — CK: Total CK: 74 U/L (ref 49–397)

## 2023-09-12 NOTE — Discharge Instructions (Addendum)
 Please go directly to the ER for further evaluation and management of your symptoms

## 2023-09-12 NOTE — ED Provider Notes (Signed)
 Patient presents today with 3-day history of right foot pain and 1 day history of left arm swelling, pain, and discoloration.  Reports he fell 3 days ago, injuring his right foot and thinks he caught himself with his left arm.  He reports he had some pain in his left upper arm this morning that has now resolved.  He has noticed that his wrist and hand are more swollen on the left side than the right.  In triage, patient is mildly hypertensive and slightly tachycardic, otherwise vital signs are stable.  On exam, capillary refill of the left upper extremity is greater than 3 seconds and there is discoloration and edema to the left upper extremity.  Explained to patient I am concerned for an acute vascular cause of symptoms such as acute DVT or compartment syndrome of left upper extremity.  I recommended further evaluation and management in the ER.  Patient is in agreement to plan and is safe to transport via private vehicle.   Wilhemena Harbour, NP 09/12/23 1148

## 2023-09-12 NOTE — ED Notes (Addendum)
 Patient is being discharged from the Urgent Care and sent to the Emergency Department via POV . Per NP, patient is in need of higher level of care due to LUE pain/discoloration,delayed cap refill. Patient is aware and verbalizes understanding of plan of care.  Vitals:   09/12/23 1120  BP: (!) 160/81  Pulse: (!) 101  Resp: 20  Temp: 98.8 F (37.1 C)  SpO2: 95%

## 2023-09-12 NOTE — ED Provider Notes (Signed)
 [place] EMERGENCY DEPARTMENT AT [facility] Provider Note   CSN: [id number] Arrival date & time: 09/12/23  1145     History Chief Complaint  Patient presents with   Foot Injury    Right   Arm Injury    Left    Jeremy David is a 33 y.o. male with h/o HTN, DM presents to the ER today for evaluation of left arm swelling and bruising as well as right foot pain. The patient reports that he has been having pain to the lateral aspect of his foot for the past 2 weeks. He reports that it is painful to walk. Two days ago, the patient went to walk, but lost his footing because of the pain and feel hitting his left arm on the chair. Was not having any pain to the arm. He denies hitting his head or neck. He reports that he woke up this morning and noted the swelling and bruising. He reports that this along with his foot pain is what prompted him to present to the ER. He denies any known injury to the right foot. He denies any numbness, tingling, temperature change, or weakness to the LUE. Denies any chest pain or SOB. Denies any other injury. He was sent over by UC given the swelling and coloration.    Foot Injury Associated symptoms: no fever   Arm Injury Associated symptoms: no fever        Home Medications Prior to Admission medications   Medication Sig Start Date End Date Taking? Authorizing Provider  cyclobenzaprine  (FLEXERIL ) 10 MG tablet TAKE 1 TABLET(10 MG) BY MOUTH THREE TIMES DAILY AS NEEDED FOR MUSCLE SPASMS 12/09/22   Bacigalupo, Stan Eans, MD  fluticasone  (FLONASE ) 50 MCG/ACT nasal spray Place 1 spray into both nostrils 2 (two) times daily. 12/29/22   Corbin Dess, PA-C  hydrochlorothiazide  (HYDRODIURIL ) 25 MG tablet Take 1 tablet (25 mg total) by mouth daily. 12/09/22   Bacigalupo, Angela M, MD  lisinopril  (ZESTRIL ) 20 MG tablet Take 1 tablet (20 mg total) by mouth daily. TAKE 1 TABLET(20 MG) BY MOUTH DAILY 06/08/23   Bacigalupo, Stan Eans, MD  meloxicam  (MOBIC ) 15  MG tablet Take 1 tablet (15 mg total) by mouth daily. 12/09/22   Bacigalupo, Angela M, MD  metFORMIN  (GLUCOPHAGE -XR) 500 MG 24 hr tablet TAKE 2 TABLETS(1000 MG) BY MOUTH DAILY WITH BREAKFAST 06/08/23   Bacigalupo, Stan Eans, MD  omeprazole  (PRILOSEC) 20 MG capsule TAKE 1 CAPSULE(20 MG) BY MOUTH DAILY 05/01/23   Bacigalupo, Stan Eans, MD  sertraline  (ZOLOFT ) 100 MG tablet TAKE 1 AND 1/2 TABLETS(150 MG) BY MOUTH DAILY 07/19/23   Bacigalupo, Stan Eans, MD  tirzepatide  (MOUNJARO ) 5 MG/0.5ML Pen Inject 5 mg into the skin once a week. 06/08/23   Bacigalupo, Angela M, MD      Allergies    Bee venom, Peanut-containing drug products, Molds & smuts, and Pollen extract    Review of Systems   Review of Systems  Constitutional:  Negative for chills and fever.  Respiratory:  Negative for shortness of breath.   Cardiovascular:  Negative for chest pain.  Musculoskeletal:  Positive for arthralgias and myalgias.  Neurological:  Negative for weakness and numbness.    Physical Exam Updated Vital Signs BP 138/78   Pulse 92   Temp 98.1 F (36.7 C) (Oral)   Resp 20   Ht 5\' 9"  (1.753 m)   Wt (!) 209.1 kg   SpO2 98%   BMI 68.08 kg/m  Physical Exam  Vitals and nursing note reviewed.  Constitutional:      General: He is not in acute distress.    Appearance: He is not ill-appearing or toxic-appearing.  HENT:     Mouth/Throat:     Mouth: Mucous membranes are moist.  Eyes:     General: No scleral icterus. Cardiovascular:     Rate and Rhythm: Normal rate.     Pulses: Normal pulses.  Pulmonary:     Effort: Pulmonary effort is normal. No respiratory distress.  Musculoskeletal:        General: Swelling, tenderness and signs of injury present.     Comments: Right foot- tenderness to the more distal lateral aspect of the foot. No overlying skin changes. No deformity noted. Compartments soft. DP and PT pulses intact. Brisk cap refill. Sensation reportedly intact per patient. No wounds noted.   LUE - The patient  has bruising/ecchymosis noted from the dorsum of the hand extending through the arm and slightly into the chest wall. He has swelling noted throughout, compartments are pliable. Palpable radial pulse 2+ and feels symmetric. Cap refill 3 seconds. He has some pain with full flexion of the elbow, but reports that it is from the arm feeling "tight". No pain with wrist movement. No snuffbox tenderness. No tenderness to palpation throughout the arm. Patient reports sensation is intact and symmetric to light and deep touch. Full ROM present in the extremity.   Skin:    General: Skin is warm and dry.  Neurological:     Mental Status: He is alert.     ED Results / Procedures / Treatments   Labs (all labs ordered are listed, but only abnormal results are displayed) Labs Reviewed  COMPREHENSIVE METABOLIC PANEL WITH GFR - Abnormal; Notable for the following components:      Result Value   Glucose, Bld 158 (*)    AST 65 (*)    ALT 80 (*)    Alkaline Phosphatase 20 (*)    Total Bilirubin 1.3 (*)    All other components within normal limits  LACTIC ACID, PLASMA  CK  CBC WITH DIFFERENTIAL/PLATELET  I-STAT CHEM 8, ED    EKG None  Radiology DG Chest 2 View Result Date: 09/12/2023 CLINICAL DATA:  Fall. EXAM: CHEST - 2 VIEW COMPARISON:  Chest x-ray 04/30/2022. FINDINGS: The heart size and mediastinal contours are within normal limits. Both lungs are clear. The visualized skeletal structures are unremarkable. IMPRESSION: No active cardiopulmonary disease. Electronically Signed   By: Tyron Gallon M.D.   On: 09/12/2023 17:22   US  Venous Img Upper Uni Left (DVT) Result Date: 09/12/2023 CLINICAL DATA:  Left arm pain. EXAM: LEFT UPPER EXTREMITY VENOUS DOPPLER ULTRASOUND TECHNIQUE: Gray-scale sonography with graded compression, as well as color Doppler and duplex ultrasound were performed to evaluate the upper extremity deep venous system from the level of the subclavian vein and including the jugular,  axillary, basilic, radial, ulnar and upper cephalic vein. Spectral Doppler was utilized to evaluate flow at rest and with distal augmentation maneuvers. COMPARISON:  None Available. FINDINGS: Contralateral Subclavian Vein: Respiratory phasicity is normal and symmetric with the symptomatic side. No evidence of thrombus. Normal compressibility. Internal Jugular Vein: No evidence of thrombus. Normal compressibility, respiratory phasicity and response to augmentation. Subclavian Vein: No evidence of thrombus. Normal compressibility, respiratory phasicity and response to augmentation. Axillary Vein: No evidence of thrombus. Normal compressibility, respiratory phasicity and response to augmentation. Cephalic Vein: No evidence of thrombus. Normal compressibility, respiratory phasicity and response to augmentation.  Basilic Vein: No evidence of thrombus. Normal compressibility, respiratory phasicity and response to augmentation. Brachial Veins: No evidence of thrombus. Normal compressibility, respiratory phasicity and response to augmentation. Radial Veins: No evidence of thrombus. Normal compressibility, respiratory phasicity and response to augmentation. Ulnar Veins: No evidence of thrombus. Normal compressibility, respiratory phasicity and response to augmentation. Venous Reflux:  None visualized. Other Findings: No evidence of superficial thrombophlebitis or abnormal fluid collection. IMPRESSION: No evidence of DVT within the left upper extremity. Electronically Signed   By: Erica Hau M.D.   On: 09/12/2023 16:43   DG Forearm Left Result Date: 09/12/2023 CLINICAL DATA:  Fall.  Caught self with left arm 3 days ago.  Pain. EXAM: LEFT HUMERUS - 2+ VIEW; LEFT ELBOW - COMPLETE 3+ VIEW; LEFT FOREARM - 2 VIEW COMPARISON:  None Available. FINDINGS: Left humerus: Normal bone mineralization. Normal glenohumeral and acromioclavicular alignment. Mild to moderate glenohumeral and mild acromioclavicular joint space narrowing  and peripheral osteophytosis. Left elbow: Moderate medial elbow joint space narrowing with moderate peripheral osteophytosis at the medial aspect of the coronoid process greater than the medial trochlea. There is a 5 mm ossicle at the proximal medial aspect of the coronoid process degenerative spurring that appears well corticated and is favored to be chronic. An acute superficial fracture of this osteophyte is felt less likely. Mild to moderate degenerative spurring at the volar tip of the coronoid process. Minimal degenerative spurring at the triceps tendon insertion on the olecranon. Normal position of the distal anterior humeral fat pad without evidence of elbow joint effusion. Left forearm: No acute fracture is seen within the more distal radius or ulna. Mild thumb carpometacarpal joint space narrowing and peripheral osteophytosis. Diffuse upper arm subcutaneous fat edema and swelling, possibly systemic. IMPRESSION: 1. No definite acute fracture. 2. 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 A small ossicle at the medial aspect of the elbow is favored to be chronic. 4. Diffuse upper arm subcutaneous fat edema and swelling, possibly systemic. Electronically Signed   By: Bertina Broccoli M.D.   On: 09/12/2023 14:57   DG Humerus Left Result Date: 09/12/2023 CLINICAL DATA:  Fall.  Caught self with left arm 3 days ago.  Pain. EXAM: LEFT HUMERUS - 2+ VIEW; LEFT ELBOW - COMPLETE 3+ VIEW; LEFT FOREARM - 2 VIEW COMPARISON:  None Available. FINDINGS: Left humerus: Normal bone mineralization. Normal glenohumeral and acromioclavicular alignment. Mild to moderate glenohumeral and mild acromioclavicular joint space narrowing and peripheral osteophytosis. Left elbow: Moderate medial elbow joint space narrowing with moderate peripheral osteophytosis at the medial aspect of the coronoid process greater than the medial trochlea. There is a 5 mm ossicle at the  proximal medial aspect of the coronoid process degenerative spurring that appears well corticated and is favored to be chronic. An acute superficial fracture of this osteophyte is felt less likely. Mild to moderate degenerative spurring at the volar tip of the coronoid process. Minimal degenerative spurring at the triceps tendon insertion on the olecranon. Normal position of the distal anterior humeral fat pad without evidence of elbow joint effusion. Left forearm: No acute fracture is seen within the more distal radius or ulna. Mild thumb carpometacarpal joint space narrowing and peripheral osteophytosis. Diffuse upper arm subcutaneous fat edema and swelling, possibly systemic. IMPRESSION: 1. No definite acute fracture. 2. 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 A small ossicle at the medial aspect of the elbow is favored to be chronic. 4. Diffuse upper arm subcutaneous  fat edema and swelling, possibly systemic. Electronically Signed   By: Bertina Broccoli M.D.   On: 09/12/2023 14:57   DG Elbow Complete Left Result Date: 09/12/2023 CLINICAL DATA:  Fall.  Caught self with left arm 3 days ago.  Pain. EXAM: LEFT HUMERUS - 2+ VIEW; LEFT ELBOW - COMPLETE 3+ VIEW; LEFT FOREARM - 2 VIEW COMPARISON:  None Available. FINDINGS: Left humerus: Normal bone mineralization. Normal glenohumeral and acromioclavicular alignment. Mild to moderate glenohumeral and mild acromioclavicular joint space narrowing and peripheral osteophytosis. Left elbow: Moderate medial elbow joint space narrowing with moderate peripheral osteophytosis at the medial aspect of the coronoid process greater than the medial trochlea. There is a 5 mm ossicle at the proximal medial aspect of the coronoid process degenerative spurring that appears well corticated and is favored to be chronic. An acute superficial fracture of this osteophyte is felt less likely. Mild to moderate degenerative  spurring at the volar tip of the coronoid process. Minimal degenerative spurring at the triceps tendon insertion on the olecranon. Normal position of the distal anterior humeral fat pad without evidence of elbow joint effusion. Left forearm: No acute fracture is seen within the more distal radius or ulna. Mild thumb carpometacarpal joint space narrowing and peripheral osteophytosis. Diffuse upper arm subcutaneous fat edema and swelling, possibly systemic. IMPRESSION: 1. No definite acute fracture. 2. 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 A small ossicle at the medial aspect of the elbow is favored to be chronic. 4. Diffuse upper arm subcutaneous fat edema and swelling, possibly systemic. Electronically Signed   By: Bertina Broccoli M.D.   On: 09/12/2023 14:57   DG Foot Complete Right Result Date: 09/12/2023 CLINICAL DATA:  Right foot pain. Inverted foot and caught self with arm 3 days ago. EXAM: RIGHT FOOT COMPLETE - 3+ VIEW COMPARISON:  None Available. FINDINGS: Minimal degenerative spurring at the lateral aspect of the great toe metatarsophalangeal joint without significant joint space narrowing. Mild-to-moderate plantar and posterior calcaneal heel spurs. Mild degenerative spurring at the lateral base of the proximal phalanx of the great toe. No definite Lisfranc interval widening on frontal nonweightbearing views. No acute fracture or dislocation. IMPRESSION: 1. Mild-to-moderate plantar and posterior calcaneal heel spurs. 2. Minimal degenerative spurring at the lateral aspect of the great toe metatarsophalangeal joint. No significant joint space narrowing. 3. No acute fracture is seen. Electronically Signed   By: Bertina Broccoli M.D.   On: 09/12/2023 14:50    Procedures Procedures   Medications Ordered in ED Medications - No data to display  ED Course/ Medical Decision Making/ A&P   Medical Decision Making Amount and/or  Complexity of Data Reviewed Labs: ordered. Radiology: ordered.   33 y.o. male presents to the ER for evaluation of left arm swelling and right foot pain. Differential diagnosis includes but is not limited to compartment syndrome, contusion, fracture, dislocation, MSK, thoracic outlet syndrome, SVC syndrome. Vital signs mildly elevated BP otherwise unremarkable. Physical exam as noted above.   Concerned given the patients swelling and ecchymosis. My attending assessed at bedside. Recommends adding on CXR. Does not think CT Angio is needed given palpable distal pulses. Does agree with ordering DVT study. He does not feel that this is compartment syndrome given that he has pulses, intact sensation, intact ROM, and soft compartments - however there is some swelling. He does not think this is thoracic outlet syndrome. Does not appear to be SVC syndrome given that he has no neurological symptoms. Plan is if  imaging does not show any acute abnormality, then close follow up with PCP with strict return precautions per attending.  I independently reviewed and interpreted the patient's labs.  CBC without leukocytosis or anemia.  Lactic acid within normal limits.  CMP did show mildly elevated glucose of 158.  Mildly elevated AST and ALT.  Mild decreased alk phos.  Total bili at 1.3.  Appears to be consistent with patient's previous.  CK within normal limits.  XR of the R foot 1. Mild-to-moderate plantar and posterior calcaneal heel spurs. 2. Minimal degenerative spurring at the lateral aspect of the great toe metatarsophalangeal joint. No significant joint space narrowing. 3. No acute fracture is seen.   DG left forearm/humerus/elbow . No definite acute fracture. 2. 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 A small ossicle at the medial aspect of the elbow is favored to be chronic. 4. Diffuse upper arm subcutaneous fat edema and swelling,  possibly systemic.   CXR shows no acute cardiopulmonary process.   DVT study is negative.   Likely having a large contusion.  He is not having any pain.  Sensations intact.  He does have an appointment with his PCP on Thursday.  Has scheduled for follow-up.  Recommended he have this reassessed at that point.  We discussed if he has any new or worsening symptoms that he needs to return to the nearest Emergency Department immediately. I have applied an ACE bandage to his LUE. His coloration of his arm has already improved significantly with elevation. On re-evaluation, still NV intact. The patient does have swelling, but compartments are pliable. He is not having any pain and wasn't having any pain to the area previously. For his foot pain, likely from the bone spurs. He was given the information for a podiatrist.   We discussed the results of the labs/imaging. The plan is RICE, follow up with PCP at scheduled appointment, strict return precautions. We discussed strict return precautions and red flag symptoms. The patient verbalized their understanding and agrees to the plan. The patient is stable and being discharged home in good condition.  I discussed this case with my attending physician who cosigned this note including patient's presenting symptoms, physical exam, and planned diagnostics and interventions. Attending physician stated agreement with plan or made changes to plan which were implemented.   Attending physician assessed patient at bedside.  Portions of this report may have been transcribed using voice recognition software. Every effort was made to ensure accuracy; however, inadvertent computerized transcription errors may be present.   Final Clinical Impression(s) / ED Diagnoses Final diagnoses:  Left arm swelling  Bruising  Arm injury, left, initial encounter    Rx / DC Orders ED Discharge Orders     None         Spence Dux, PA-C 09/17/23 9147    Deatra Face,  MD 09/17/23 1113

## 2023-09-12 NOTE — ED Triage Notes (Signed)
 Pt presents with left arm pain and right foot pain after losing footing over 1 week ago.

## 2023-09-12 NOTE — ED Triage Notes (Addendum)
 Pt reports left upper arm pain last night and right foot pain on Saturday. Pt reports feet got tangled up and reports fell and outreached left arm to catch body. Pt reports right foot pain ever since. Pt reports left arm tightness since last night.   Purple discoloration, swelling and delayed cap refill of LUE. Provider aware and at bedside.

## 2023-09-12 NOTE — Discharge Instructions (Addendum)
 You were seen in the ER today for evaluation of your arm. Your workup was unremarkable and is likely a muscle injury. You do have bone spurs at the bottom of your foot which is likely causing the pain. I have included the information for a foot doctor to follow up with (Dr.[email or web address]). Additionally, I would like for you to keep this elevated to help with swelling. I have included more information on the RICE method. You can take Tylenol or ibuprofen as needed for pain. I would like for you to follow up with Dr. Phyllis Breeze to re-evaluate your arm. Please make sure you have you PCP look at the arm this week as well. Keep a close eye on this, if it starts to change colors, temperature, swelling, numbness, worsening pain, tingling, unable to move it, fever, chest pain, SOB, return to the ER immediately. If you have any other concerns, new or worsening symptoms please return to the ER for re-evaluation.    Contact a doctor if: The skin around the cast or splint gets red or raw. The skin under the cast is very itchy or painful. Your cast or splint: Gets damaged. Feels very uncomfortable. Is too tight or too loose. . Get help right away if: You get any symptoms of compartment syndrome, such as: Very bad pain or pressure under the cast. Numbness, tingling, coldness, or pale or bluish skin. The part of your body above or below the cast is swollen, and it turns a different color (is discolored). You cannot feel or move your fingers or toes. Your pain gets worse. There is fluid leaking through the cast. You have trouble breathing or shortness of breath. You have chest pain. These symptoms may be an emergency. Get help right away. Call your local emergency services (911 in the [place] Do not wait to see if the symptoms will go away. Do not drive yourself to the hospital.

## 2023-09-14 ENCOUNTER — Ambulatory Visit: Admitting: Family Medicine

## 2023-09-14 ENCOUNTER — Encounter: Payer: Self-pay | Admitting: Family Medicine

## 2023-09-14 VITALS — BP 138/78 | HR 95 | Ht 69.0 in | Wt >= 6400 oz

## 2023-09-14 DIAGNOSIS — Z7984 Long term (current) use of oral hypoglycemic drugs: Secondary | ICD-10-CM

## 2023-09-14 DIAGNOSIS — E1169 Type 2 diabetes mellitus with other specified complication: Secondary | ICD-10-CM

## 2023-09-14 DIAGNOSIS — F411 Generalized anxiety disorder: Secondary | ICD-10-CM | POA: Diagnosis not present

## 2023-09-14 DIAGNOSIS — I152 Hypertension secondary to endocrine disorders: Secondary | ICD-10-CM

## 2023-09-14 DIAGNOSIS — E785 Hyperlipidemia, unspecified: Secondary | ICD-10-CM

## 2023-09-14 DIAGNOSIS — F3341 Major depressive disorder, recurrent, in partial remission: Secondary | ICD-10-CM

## 2023-09-14 DIAGNOSIS — E1159 Type 2 diabetes mellitus with other circulatory complications: Secondary | ICD-10-CM

## 2023-09-14 DIAGNOSIS — M7989 Other specified soft tissue disorders: Secondary | ICD-10-CM

## 2023-09-14 MED ORDER — TIRZEPATIDE 7.5 MG/0.5ML ~~LOC~~ SOAJ: 7.5000 mg | SUBCUTANEOUS | 1 refills | Status: DC

## 2023-09-14 MED ORDER — CYCLOBENZAPRINE HCL 10 MG PO TABS: ORAL_TABLET | ORAL | 0 refills | Status: DC

## 2023-09-14 MED ORDER — MELOXICAM 15 MG PO TABS: 15.0000 mg | ORAL_TABLET | Freq: Every day | ORAL | 1 refills | Status: DC

## 2023-09-14 MED ORDER — ROSUVASTATIN CALCIUM 5 MG PO TABS
5.0000 mg | ORAL_TABLET | Freq: Every day | ORAL | 3 refills | Status: DC
Start: 2023-09-14 — End: 2023-12-21

## 2023-09-14 NOTE — Assessment & Plan Note (Signed)
 Blood pressure readings have been variable, likely influenced by pain and stress. Recent reading was 138/78, improved from previous ER visit. Wel lcontrolled by end of visit - continue HCTZ and lisinorpil

## 2023-09-14 NOTE — Assessment & Plan Note (Signed)
 A1c improved from 10.4 to 9.1 but remains elevated. Current treatment includes metformin and Mounjaro, which is well-tolerated and has contributed to weight loss. Discussed increasing Mounjaro to optimize glycemic control and further reduce A1c without adverse effects. - Increase Mounjaro to 7.5 mg weekly to further reduce A1c

## 2023-09-14 NOTE — Assessment & Plan Note (Signed)
 Mood and anxiety are well-controlled on current Zoloft regimen. Continue zoloft 150mg  daily.

## 2023-09-14 NOTE — Assessment & Plan Note (Signed)
 Cholesterol levels remain elevated with LDL at 177, far from the target of under 70 due to diabetes. Discussed starting statin therapy to reduce cardiovascular risk, explaining potential muscle aches as a side effect and the use of CoQ10 to mitigate this. Emphasized the importance of early prevention to avoid future cardiovascular events. - Prescribe generic Crestor (rosuvastatin) 5 mg daily - Discuss potential muscle aches as a side effect and the use of CoQ10 to mitigate this

## 2023-09-14 NOTE — Progress Notes (Signed)
 Established patient visit   Patient: Jeremy David   DOB: 27-Apr-1991   33 y.o. Male  MRN: [id number] Visit Date: 09/14/2023  Today's healthcare provider: Shirlee Latch, MD   Chief Complaint  Patient presents with   Follow-up    Went to er after falling on left arm for potential muscle trauma and they told pt to follow up with pcp   Hypertension    Pt occasionally check bp, no diet, walks at work ([employer])    Diabetes    Frequent urination, trying to cut back on sugars, occasionally check sugars at home   Medication Refill    Flexeril refill request   Subjective    Hypertension  Diabetes  Medication Refill   HPI     Follow-up    Additional comments: Went to er after falling on left arm for potential muscle trauma and they told pt to follow up with pcp        Hypertension    Additional comments: Pt occasionally check bp, no diet, walks at work ([employer])         Diabetes    Additional comments: Frequent urination, trying to cut back on sugars, occasionally check sugars at home        Medication Refill    Additional comments: Flexeril refill request      Last edited by Allayne Stack on 09/14/2023  8:19 AM.       Discussed the use of AI scribe software for clinical note transcription with the patient, who gave verbal consent to proceed.  History of Present Illness   A 33 year old patient with a history of diabetes, hypertension, hyperlipidemia, generalized anxiety disorder, and major depressive disorder presents for a chronic follow-up visit. The patient's current medications include hydrochlorothiazide 25mg  daily, lisinopril 20mg  daily, metformin XR 1000mg  daily, Zoloft 150mg  daily, and Mounjaro 5mg  weekly. The patient reports that mood and anxiety have been well-managed with Zoloft.  The patient recently visited the ER due to swelling and pain in the arm, which is still present. The patient describes the pain as located in the armpit and reports that  the arm fatigues easily. The patient also mentions a recent fall on the affected arm, which may have contributed to the current symptoms.  The patient also reports elevated blood sugar and cholesterol levels. The patient has noticed some weight loss since starting [facility] and is open to increasing the dosage. The patient also expresses willingness to start a cholesterol medication to lower the risk of heart disease.  The patient also mentions having bone spurs in the foot, which have caused some discomfort and imbalance, leading to the recent fall.         Medications: Outpatient Medications Prior to Visit  Medication Sig Note   fluticasone (FLONASE) 50 MCG/ACT nasal spray Place 1 spray into both nostrils 2 (two) times daily.    hydrochlorothiazide (HYDRODIURIL) 25 MG tablet Take 1 tablet (25 mg total) by mouth daily.    lisinopril (ZESTRIL) 20 MG tablet Take 1 tablet (20 mg total) by mouth daily. TAKE 1 TABLET(20 MG) BY MOUTH DAILY    metFORMIN (GLUCOPHAGE-XR) 500 MG 24 hr tablet TAKE 2 TABLETS(1000 MG) BY MOUTH DAILY WITH BREAKFAST    omeprazole (PRILOSEC) 20 MG capsule TAKE 1 CAPSULE(20 MG) BY MOUTH DAILY    sertraline (ZOLOFT) 100 MG tablet TAKE 1 AND 1/2 TABLETS(150 MG) BY MOUTH DAILY    [DISCONTINUED] cyclobenzaprine (FLEXERIL) 10 MG tablet TAKE 1 TABLET(10 MG) BY MOUTH  THREE TIMES DAILY AS NEEDED FOR MUSCLE SPASMS 09/14/2023: Needs refill   [DISCONTINUED] meloxicam (MOBIC) 15 MG tablet Take 1 tablet (15 mg total) by mouth daily.    [DISCONTINUED] tirzepatide [facility]) 5 MG/0.5ML Pen Inject 5 mg into the skin once a week.    No facility-administered medications prior to visit.    Review of Systems     Objective    BP 138/78 (BP Location: Right Arm, Patient Position: Sitting, Cuff Size: Large)   Pulse 95   Ht 5\' 9"  (1.753 m)   Wt (!) 465 lb 8 oz (211.1 kg)   SpO2 96%   BMI 68.74 kg/m    Physical Exam Vitals reviewed.  Constitutional:      General: He is not in acute  distress.    Appearance: Normal appearance. He is not diaphoretic.  HENT:     Head: Normocephalic and atraumatic.  Eyes:     General: No scleral icterus.    Conjunctiva/sclera: Conjunctivae normal.  Cardiovascular:     Rate and Rhythm: Normal rate and regular rhythm.     Heart sounds: Normal heart sounds. No murmur heard. Pulmonary:     Effort: Pulmonary effort is normal. No respiratory distress.     Breath sounds: Normal breath sounds. No wheezing or rhonchi.  Musculoskeletal:     Cervical back: Neck supple.     Right lower leg: No edema.     Left lower leg: No edema.     Comments: L arm swelling  Lymphadenopathy:     Cervical: No cervical adenopathy.  Skin:    General: Skin is warm and dry.     Findings: No rash.  Neurological:     Mental Status: He is alert and oriented to person, place, and time. Mental status is at baseline.  Psychiatric:        Mood and Affect: Mood normal.        Behavior: Behavior normal.      No results found for any visits on 09/14/23.  Assessment & Plan     Problem List Items Addressed This Visit       Cardiovascular and Mediastinum   Hypertension associated with diabetes (HCC)   Blood pressure readings have been variable, likely influenced by pain and stress. Recent reading was 138/78, improved from previous ER visit. Wel lcontrolled by end of visit - continue HCTZ and lisinorpil      Relevant Medications   tirzepatide (MOUNJARO) 7.5 MG/0.5ML Pen   rosuvastatin (CRESTOR) 5 MG tablet     Endocrine   T2DM (type 2 diabetes mellitus) (HCC) - Primary   A1c improved from 10.4 to 9.1 but remains elevated. Current treatment includes metformin and Mounjaro, which is well-tolerated and has contributed to weight loss. Discussed increasing Mounjaro to optimize glycemic control and further reduce A1c without adverse effects. - Increase Mounjaro to 7.5 mg weekly to further reduce A1c      Relevant Medications   tirzepatide (MOUNJARO) 7.5 MG/0.5ML  Pen   rosuvastatin (CRESTOR) 5 MG tablet   Hyperlipidemia associated with type 2 diabetes mellitus (HCC)   Cholesterol levels remain elevated with LDL at 177, far from the target of under 70 due to diabetes. Discussed starting statin therapy to reduce cardiovascular risk, explaining potential muscle aches as a side effect and the use of CoQ10 to mitigate this. Emphasized the importance of early prevention to avoid future cardiovascular events. - Prescribe generic Crestor (rosuvastatin) 5 mg daily - Discuss potential muscle aches as a side effect  and the use of CoQ10 to mitigate this      Relevant Medications   tirzepatide (MOUNJARO) 7.5 MG/0.5ML Pen   rosuvastatin (CRESTOR) 5 MG tablet     Other   Morbid obesity (HCC)   Discussed importance of healthy weight management Discussed diet and exercise       Relevant Medications   tirzepatide (MOUNJARO) 7.5 MG/0.5ML Pen   MDD (major depressive disorder)   Mood and anxiety are well-controlled on current Zoloft regimen. Continue zoloft 150mg  daily.      GAD (generalized anxiety disorder)   Mood and anxiety are well-controlled on current Zoloft regimen. Continue zoloft 150mg  daily.      Other Visit Diagnoses       Left arm swelling               L Arm Swelling and Pain Swelling and pain in the arm, likely due to muscle trauma from a fall. Imaging ruled out fractures and DVT. Swelling causes fatigue and tension, especially when dressing. - Advise elevation and icing of the arm to reduce swelling - Prescribe meloxicam for inflammation - Prescribe Flexeril for muscle relaxation, noting it may not alleviate the tense feeling due to swelling  General Health Maintenance Discussed the importance of preventive measures to reduce cardiovascular risk.  Follow-up Plan to reassess conditions and treatment efficacy. - Schedule follow-up in 3 months for physical examination and lab re-evaluation - Perform labs prior to next visit to assess  A1c and cholesterol levels        Return in about 3 months (around 12/14/2023) for CPE.       Aden Agreste, MD  [facility] [phone] (phone) [phone] (fax)  [facility]

## 2023-09-14 NOTE — Assessment & Plan Note (Signed)
 Discussed importance of healthy weight management Discussed diet and exercise

## 2023-09-16 NOTE — ED Provider Notes (Incomplete)
 [place] EMERGENCY DEPARTMENT AT [facility] Provider Note   CSN: [id number] Arrival date & time: 09/12/23  1145     History Chief Complaint  Patient presents with  . Foot Injury    Right  . Arm Injury    Left    Jeremy David is a 33 y.o. male with h/o HTN, DM presents to the ER today for evaluation of left arm swelling and bruising as well as right foot pain. The patient reports that he has been having pain to the lateral aspect of his foot for the past 2 weeks. He reports that it is painful to walk. Two days ago, the patient went to walk, but lost his footing because of the pain and feel hitting his left arm on the chair. Was not having any pain to the arm. He reports that he woke up this morning and noted the swelling and bruising. He reports that this along with his foot pain is what prompted him to present to the ER. He denies any known injury to the right foot. He denies any numbness, tingling, temperature change, or weakness to the LUE. Denies any chest pain or SOB. Denies any other injury.    Foot Injury Associated symptoms: no fever   Arm Injury Associated symptoms: no fever        Home Medications Prior to Admission medications   Medication Sig Start Date End Date Taking? Authorizing Provider  cyclobenzaprine  (FLEXERIL ) 10 MG tablet TAKE 1 TABLET(10 MG) BY MOUTH THREE TIMES DAILY AS NEEDED FOR MUSCLE SPASMS 12/09/22   Bacigalupo, Stan Eans, MD  fluticasone  (FLONASE ) 50 MCG/ACT nasal spray Place 1 spray into both nostrils 2 (two) times daily. 12/29/22   Corbin Dess, PA-C  hydrochlorothiazide  (HYDRODIURIL ) 25 MG tablet Take 1 tablet (25 mg total) by mouth daily. 12/09/22   Bacigalupo, Angela M, MD  lisinopril  (ZESTRIL ) 20 MG tablet Take 1 tablet (20 mg total) by mouth daily. TAKE 1 TABLET(20 MG) BY MOUTH DAILY 06/08/23   Bacigalupo, Stan Eans, MD  meloxicam  (MOBIC ) 15 MG tablet Take 1 tablet (15 mg total) by mouth daily. 12/09/22   Bacigalupo, Angela M, MD   metFORMIN  (GLUCOPHAGE -XR) 500 MG 24 hr tablet TAKE 2 TABLETS(1000 MG) BY MOUTH DAILY WITH BREAKFAST 06/08/23   Bacigalupo, Stan Eans, MD  omeprazole  (PRILOSEC) 20 MG capsule TAKE 1 CAPSULE(20 MG) BY MOUTH DAILY 05/01/23   Bacigalupo, Stan Eans, MD  sertraline  (ZOLOFT ) 100 MG tablet TAKE 1 AND 1/2 TABLETS(150 MG) BY MOUTH DAILY 07/19/23   Bacigalupo, Angela M, MD  tirzepatide  (MOUNJARO ) 5 MG/0.5ML Pen Inject 5 mg into the skin once a week. 06/08/23   Bacigalupo, Angela M, MD      Allergies    Bee venom, Peanut-containing drug products, Molds & smuts, and Pollen extract    Review of Systems   Review of Systems  Constitutional:  Negative for chills and fever.  Respiratory:  Negative for shortness of breath.   Cardiovascular:  Negative for chest pain.  Musculoskeletal:  Positive for arthralgias and myalgias.  Neurological:  Negative for weakness and numbness.    Physical Exam Updated Vital Signs BP 138/78   Pulse 92   Temp 98.1 F (36.7 C) (Oral)   Resp 20   Ht 5\' 9"  (1.753 m)   Wt (!) 209.1 kg   SpO2 98%   BMI 68.08 kg/m  Physical Exam Vitals and nursing note reviewed.  Constitutional:      General: He is not in acute  distress.    Appearance: He is not ill-appearing or toxic-appearing.  HENT:     Mouth/Throat:     Mouth: Mucous membranes are moist.  Eyes:     General: No scleral icterus. Cardiovascular:     Rate and Rhythm: Normal rate.     Pulses: Normal pulses.  Pulmonary:     Effort: Pulmonary effort is normal. No respiratory distress.  Musculoskeletal:        General: Swelling, tenderness and signs of injury present.  Skin:    General: Skin is warm and dry.  Neurological:     Mental Status: He is alert.     ED Results / Procedures / Treatments   Labs (all labs ordered are listed, but only abnormal results are displayed) Labs Reviewed  COMPREHENSIVE METABOLIC PANEL WITH GFR - Abnormal; Notable for the following components:      Result Value   Glucose, Bld 158 (*)     AST 65 (*)    ALT 80 (*)    Alkaline Phosphatase 20 (*)    Total Bilirubin 1.3 (*)    All other components within normal limits  LACTIC ACID, PLASMA  CK  CBC WITH DIFFERENTIAL/PLATELET  I-STAT CHEM 8, ED    EKG None  Radiology DG Chest 2 View Result Date: 09/12/2023 CLINICAL DATA:  Fall. EXAM: CHEST - 2 VIEW COMPARISON:  Chest x-ray 04/30/2022. FINDINGS: The heart size and mediastinal contours are within normal limits. Both lungs are clear. The visualized skeletal structures are unremarkable. IMPRESSION: No active cardiopulmonary disease. Electronically Signed   By: Tyron Gallon M.D.   On: 09/12/2023 17:22   US  Venous Img Upper Uni Left (DVT) Result Date: 09/12/2023 CLINICAL DATA:  Left arm pain. EXAM: LEFT UPPER EXTREMITY VENOUS DOPPLER ULTRASOUND TECHNIQUE: Gray-scale sonography with graded compression, as well as color Doppler and duplex ultrasound were performed to evaluate the upper extremity deep venous system from the level of the subclavian vein and including the jugular, axillary, basilic, radial, ulnar and upper cephalic vein. Spectral Doppler was utilized to evaluate flow at rest and with distal augmentation maneuvers. COMPARISON:  None Available. FINDINGS: Contralateral Subclavian Vein: Respiratory phasicity is normal and symmetric with the symptomatic side. No evidence of thrombus. Normal compressibility. Internal Jugular Vein: No evidence of thrombus. Normal compressibility, respiratory phasicity and response to augmentation. Subclavian Vein: No evidence of thrombus. Normal compressibility, respiratory phasicity and response to augmentation. Axillary Vein: No evidence of thrombus. Normal compressibility, respiratory phasicity and response to augmentation. Cephalic Vein: No evidence of thrombus. Normal compressibility, respiratory phasicity and response to augmentation. Basilic Vein: No evidence of thrombus. Normal compressibility, respiratory phasicity and response to  augmentation. Brachial Veins: No evidence of thrombus. Normal compressibility, respiratory phasicity and response to augmentation. Radial Veins: No evidence of thrombus. Normal compressibility, respiratory phasicity and response to augmentation. Ulnar Veins: No evidence of thrombus. Normal compressibility, respiratory phasicity and response to augmentation. Venous Reflux:  None visualized. Other Findings: No evidence of superficial thrombophlebitis or abnormal fluid collection. IMPRESSION: No evidence of DVT within the left upper extremity. Electronically Signed   By: Erica Hau M.D.   On: 09/12/2023 16:43   DG Forearm Left Result Date: 09/12/2023 CLINICAL DATA:  Fall.  Caught self with left arm 3 days ago.  Pain. EXAM: LEFT HUMERUS - 2+ VIEW; LEFT ELBOW - COMPLETE 3+ VIEW; LEFT FOREARM - 2 VIEW COMPARISON:  None Available. FINDINGS: Left humerus: Normal bone mineralization. Normal glenohumeral and acromioclavicular alignment. Mild to moderate glenohumeral and mild acromioclavicular  joint space narrowing and peripheral osteophytosis. Left elbow: Moderate medial elbow joint space narrowing with moderate peripheral osteophytosis at the medial aspect of the coronoid process greater than the medial trochlea. There is a 5 mm ossicle at the proximal medial aspect of the coronoid process degenerative spurring that appears well corticated and is favored to be chronic. An acute superficial fracture of this osteophyte is felt less likely. Mild to moderate degenerative spurring at the volar tip of the coronoid process. Minimal degenerative spurring at the triceps tendon insertion on the olecranon. Normal position of the distal anterior humeral fat pad without evidence of elbow joint effusion. Left forearm: No acute fracture is seen within the more distal radius or ulna. Mild thumb carpometacarpal joint space narrowing and peripheral osteophytosis. Diffuse upper arm subcutaneous fat edema and swelling, possibly  systemic. IMPRESSION: 1. No definite acute fracture. 2. 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 A small ossicle at the medial aspect of the elbow is favored to be chronic. 4. Diffuse upper arm subcutaneous fat edema and swelling, possibly systemic. Electronically Signed   By: Bertina Broccoli M.D.   On: 09/12/2023 14:57   DG Humerus Left Result Date: 09/12/2023 CLINICAL DATA:  Fall.  Caught self with left arm 3 days ago.  Pain. EXAM: LEFT HUMERUS - 2+ VIEW; LEFT ELBOW - COMPLETE 3+ VIEW; LEFT FOREARM - 2 VIEW COMPARISON:  None Available. FINDINGS: Left humerus: Normal bone mineralization. Normal glenohumeral and acromioclavicular alignment. Mild to moderate glenohumeral and mild acromioclavicular joint space narrowing and peripheral osteophytosis. Left elbow: Moderate medial elbow joint space narrowing with moderate peripheral osteophytosis at the medial aspect of the coronoid process greater than the medial trochlea. There is a 5 mm ossicle at the proximal medial aspect of the coronoid process degenerative spurring that appears well corticated and is favored to be chronic. An acute superficial fracture of this osteophyte is felt less likely. Mild to moderate degenerative spurring at the volar tip of the coronoid process. Minimal degenerative spurring at the triceps tendon insertion on the olecranon. Normal position of the distal anterior humeral fat pad without evidence of elbow joint effusion. Left forearm: No acute fracture is seen within the more distal radius or ulna. Mild thumb carpometacarpal joint space narrowing and peripheral osteophytosis. Diffuse upper arm subcutaneous fat edema and swelling, possibly systemic. IMPRESSION: 1. No definite acute fracture. 2. 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 A small ossicle at the medial aspect of the elbow is favored to  be chronic. 4. Diffuse upper arm subcutaneous fat edema and swelling, possibly systemic. Electronically Signed   By: Bertina Broccoli M.D.   On: 09/12/2023 14:57   DG Elbow Complete Left Result Date: 09/12/2023 CLINICAL DATA:  Fall.  Caught self with left arm 3 days ago.  Pain. EXAM: LEFT HUMERUS - 2+ VIEW; LEFT ELBOW - COMPLETE 3+ VIEW; LEFT FOREARM - 2 VIEW COMPARISON:  None Available. FINDINGS: Left humerus: Normal bone mineralization. Normal glenohumeral and acromioclavicular alignment. Mild to moderate glenohumeral and mild acromioclavicular joint space narrowing and peripheral osteophytosis. Left elbow: Moderate medial elbow joint space narrowing with moderate peripheral osteophytosis at the medial aspect of the coronoid process greater than the medial trochlea. There is a 5 mm ossicle at the proximal medial aspect of the coronoid process degenerative spurring that appears well corticated and is favored to be chronic. An acute superficial fracture of this osteophyte is felt less likely. Mild to moderate degenerative spurring at  the volar tip of the coronoid process. Minimal degenerative spurring at the triceps tendon insertion on the olecranon. Normal position of the distal anterior humeral fat pad without evidence of elbow joint effusion. Left forearm: No acute fracture is seen within the more distal radius or ulna. Mild thumb carpometacarpal joint space narrowing and peripheral osteophytosis. Diffuse upper arm subcutaneous fat edema and swelling, possibly systemic. IMPRESSION: 1. No definite acute fracture. 2. 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 A small ossicle at the medial aspect of the elbow is favored to be chronic. 4. Diffuse upper arm subcutaneous fat edema and swelling, possibly systemic. Electronically Signed   By: Bertina Broccoli M.D.   On: 09/12/2023 14:57   DG Foot Complete Right Result Date: 09/12/2023 CLINICAL DATA:   Right foot pain. Inverted foot and caught self with arm 3 days ago. EXAM: RIGHT FOOT COMPLETE - 3+ VIEW COMPARISON:  None Available. FINDINGS: Minimal degenerative spurring at the lateral aspect of the great toe metatarsophalangeal joint without significant joint space narrowing. Mild-to-moderate plantar and posterior calcaneal heel spurs. Mild degenerative spurring at the lateral base of the proximal phalanx of the great toe. No definite Lisfranc interval widening on frontal nonweightbearing views. No acute fracture or dislocation. IMPRESSION: 1. Mild-to-moderate plantar and posterior calcaneal heel spurs. 2. Minimal degenerative spurring at the lateral aspect of the great toe metatarsophalangeal joint. No significant joint space narrowing. 3. No acute fracture is seen. Electronically Signed   By: Bertina Broccoli M.D.   On: 09/12/2023 14:50    Procedures Procedures   Medications Ordered in ED Medications - No data to display  ED Course/ Medical Decision Making/ A&P   Medical Decision Making Amount and/or Complexity of Data Reviewed Labs: ordered. Radiology: ordered.   33 y.o. male presents to the ER for evaluation of left arm swelling and right foot pain. Differential diagnosis includes but is not limited to compartment syndrome, contusion, fracture, dislocation, MSK. Vital signs mildly elevated BP otherwise unremarkable. Physical exam as noted above.   Concerned given the patients swelling and ecchymosis. My attending assessed at bedside. Recommends adding on CXR. Does not think CT Angio is needed given palpable distal pulses.   I independently reviewed and interpreted the patient's labs. ***.  XR of the R foot 1. Mild-to-moderate plantar and posterior calcaneal heel spurs. 2. Minimal degenerative spurring at the lateral aspect of the great toe metatarsophalangeal joint. No significant joint space narrowing. 3. No acute fracture is seen.   DG left forearm/humerus/elbow . No definite acute  fracture. 2. 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 A small ossicle at the medial aspect of the elbow is favored to be chronic. 4. Diffuse upper arm subcutaneous fat edema and swelling, possibly systemic.   CXR shows no acute cardiopulmonary process.   DVT study is negative.    We discussed the results of the labs/imaging. The plan is RICE, follow up with PCP at scheduled appointment, strict return precautions. We discussed strict return precautions and red flag symptoms. The patient verbalized their understanding and agrees to the plan. The patient is stable and being discharged home in good condition.  I discussed this case with my attending physician who cosigned this note including patient's presenting symptoms, physical exam, and planned diagnostics and interventions. Attending physician stated agreement with plan or made changes to plan which were implemented.   Attending physician assessed patient at bedside.  Portions of this report may have been transcribed using  voice recognition software. Every effort was made to ensure accuracy; however, inadvertent computerized transcription errors may be present.   Final Clinical Impression(s) / ED Diagnoses Final diagnoses:  Left arm swelling  Bruising  Arm injury, left, initial encounter    Rx / DC Orders ED Discharge Orders     None

## 2023-10-18 ENCOUNTER — Other Ambulatory Visit (HOSPITAL_COMMUNITY): Payer: Self-pay

## 2023-10-29 ENCOUNTER — Other Ambulatory Visit: Payer: Self-pay | Admitting: Family Medicine

## 2023-11-26 ENCOUNTER — Other Ambulatory Visit: Payer: Self-pay | Admitting: Family Medicine

## 2023-11-28 NOTE — Telephone Encounter (Signed)
 Requested Prescriptions  Pending Prescriptions Disp Refills   hydrochlorothiazide  (HYDRODIURIL ) 25 MG tablet [Pharmacy Med Name: HYDROCHLOROTHIAZIDE  25MG  TABLETS] 90 tablet 0    Sig: TAKE 1 TABLET(25 MG) BY MOUTH DAILY     Cardiovascular: Diuretics - Thiazide Passed - 11/28/2023 11:42 AM      Passed - Cr in normal range and within 180 days    Creatinine, Ser  Date Value Ref Range Status  09/12/2023 0.67 0.61 - 1.24 mg/dL Final         Passed - K in normal range and within 180 days    Potassium  Date Value Ref Range Status  09/12/2023 3.7 3.5 - 5.1 mmol/L Final         Passed - Na in normal range and within 180 days    Sodium  Date Value Ref Range Status  09/12/2023 135 135 - 145 mmol/L Final  09/07/2023 137 134 - 144 mmol/L Final         Passed - Last BP in normal range    BP Readings from Last 1 Encounters:  09/14/23 138/78         Passed - Valid encounter within last 6 months    Recent Outpatient Visits           2 months ago Type 2 diabetes mellitus with other specified complication, without long-term current use of insulin (HCC)   [facility] [place], Jon HERO, MD       Future Appointments             In 3 weeks Bacigalupo, Jon HERO, MD [facility]             metFORMIN  (GLUCOPHAGE -XR) 500 MG 24 hr tablet [Pharmacy Med Name: METFORMIN  ER 500MG  24HR TABS] 180 tablet 0    Sig: TAKE 2 TABLETS(1000 MG) BY MOUTH DAILY WITH BREAKFAST     Endocrinology:  Diabetes - Biguanides Failed - 11/28/2023 11:42 AM      Failed - HBA1C is between 0 and 7.9 and within 180 days    Hgb A1c MFr Bld  Date Value Ref Range Status  09/07/2023 9.1 (H) 4.8 - 5.6 % Final    Comment:             Prediabetes: 5.7 - 6.4          Diabetes: >6.4          Glycemic control for adults with diabetes: <7.0          Failed - B12 Level in normal range and within 720 days    No results found for: VITAMINB12       Passed - Cr in  normal range and within 360 days    Creatinine, Ser  Date Value Ref Range Status  09/12/2023 0.67 0.61 - 1.24 mg/dL Final         Passed - eGFR in normal range and within 360 days    GFR calc Af Amer  Date Value Ref Range Status  07/16/2018 139 >59 mL/min/1.73 Final   GFR, Estimated  Date Value Ref Range Status  09/12/2023 >60 >60 mL/min Final    Comment:    (NOTE) Calculated using the CKD-EPI Creatinine Equation (2021)    eGFR  Date Value Ref Range Status  09/07/2023 123 >59 mL/min/1.73 Final         Passed - Valid encounter within last 6 months    Recent Outpatient Visits  2 months ago Type 2 diabetes mellitus with other specified complication, without long-term current use of insulin (HCC)   [facility] [place], Jon HERO, MD       Future Appointments             In 3 weeks Bacigalupo, Jon HERO, MD [facility]            Passed - CBC within normal limits and completed in the last 12 months    WBC  Date Value Ref Range Status  09/12/2023 8.4 4.0 - 10.5 K/uL Final   RBC  Date Value Ref Range Status  09/12/2023 4.71 4.22 - 5.81 MIL/uL Final   Hemoglobin  Date Value Ref Range Status  09/12/2023 14.6 13.0 - 17.0 g/dL Final   HCT  Date Value Ref Range Status  09/12/2023 43.8 39.0 - 52.0 % Final   MCHC  Date Value Ref Range Status  09/12/2023 33.3 30.0 - 36.0 g/dL Final   [facility]  Date Value Ref Range Status  09/12/2023 31.0 26.0 - 34.0 pg Final   MCV  Date Value Ref Range Status  09/12/2023 93.0 80.0 - 100.0 fL Final   No results found for: PLTCOUNTKUC, LABPLAT, POCPLA RDW  Date Value Ref Range Status  09/12/2023 13.1 11.5 - 15.5 % Final

## 2023-12-21 ENCOUNTER — Other Ambulatory Visit: Payer: Self-pay

## 2023-12-21 ENCOUNTER — Ambulatory Visit: Admitting: Family Medicine

## 2023-12-21 VITALS — BP 128/78 | HR 96 | Ht 70.0 in | Wt >= 6400 oz

## 2023-12-21 DIAGNOSIS — E1101 Type 2 diabetes mellitus with hyperosmolarity with coma: Secondary | ICD-10-CM

## 2023-12-21 DIAGNOSIS — Z0000 Encounter for general adult medical examination without abnormal findings: Secondary | ICD-10-CM | POA: Diagnosis not present

## 2023-12-21 DIAGNOSIS — E1169 Type 2 diabetes mellitus with other specified complication: Secondary | ICD-10-CM | POA: Diagnosis not present

## 2023-12-21 DIAGNOSIS — R6882 Decreased libido: Secondary | ICD-10-CM

## 2023-12-21 DIAGNOSIS — Z789 Other specified health status: Secondary | ICD-10-CM | POA: Diagnosis not present

## 2023-12-21 DIAGNOSIS — Z794 Long term (current) use of insulin: Secondary | ICD-10-CM

## 2023-12-21 DIAGNOSIS — Z23 Encounter for immunization: Secondary | ICD-10-CM | POA: Diagnosis not present

## 2023-12-21 DIAGNOSIS — E785 Hyperlipidemia, unspecified: Secondary | ICD-10-CM

## 2023-12-21 DIAGNOSIS — E1159 Type 2 diabetes mellitus with other circulatory complications: Secondary | ICD-10-CM

## 2023-12-21 DIAGNOSIS — I152 Hypertension secondary to endocrine disorders: Secondary | ICD-10-CM

## 2023-12-21 DIAGNOSIS — M542 Cervicalgia: Secondary | ICD-10-CM

## 2023-12-21 MED ORDER — TIRZEPATIDE 10 MG/0.5ML ~~LOC~~ SOAJ
10.0000 mg | SUBCUTANEOUS | 1 refills | Status: DC
  Filled 2023-12-21: qty 2, 28d supply, fill #0
  Filled 2024-01-20: qty 2, 28d supply, fill #1
  Filled 2024-02-26: qty 2, 28d supply, fill #2
  Filled 2024-05-28: qty 2, 28d supply, fill #3

## 2023-12-21 MED ORDER — METAXALONE 800 MG PO TABS
800.0000 mg | ORAL_TABLET | Freq: Three times a day (TID) | ORAL | 1 refills | Status: AC | PRN
  Filled 2023-12-21: qty 90, 30d supply, fill #0

## 2023-12-21 MED ORDER — HYDROCHLOROTHIAZIDE 25 MG PO TABS
25.0000 mg | ORAL_TABLET | Freq: Every day | ORAL | 1 refills | Status: AC
  Filled 2023-12-21 – 2024-01-20 (×2): qty 30, 30d supply, fill #0
  Filled 2024-01-22 – 2024-02-26 (×2): qty 90, 90d supply, fill #0
  Filled 2024-06-04: qty 90, 90d supply, fill #1

## 2023-12-21 MED ORDER — MELOXICAM 15 MG PO TABS
15.0000 mg | ORAL_TABLET | Freq: Every day | ORAL | 1 refills | Status: AC
  Filled 2023-12-21: qty 30, 30d supply, fill #0
  Filled 2024-05-28: qty 30, 30d supply, fill #1

## 2023-12-21 MED ORDER — SERTRALINE HCL 100 MG PO TABS
150.0000 mg | ORAL_TABLET | Freq: Every day | ORAL | 1 refills | Status: AC
  Filled 2023-12-21: qty 45, 30d supply, fill #0
  Filled 2024-01-20: qty 135, 90d supply, fill #0
  Filled 2024-04-26 – 2024-05-28 (×2): qty 135, 90d supply, fill #1

## 2023-12-21 MED ORDER — METFORMIN HCL ER 500 MG PO TB24
1000.0000 mg | ORAL_TABLET | Freq: Every day | ORAL | 1 refills | Status: DC
  Filled 2023-12-21 – 2024-02-26 (×4): qty 180, 90d supply, fill #0

## 2023-12-21 MED ORDER — ROSUVASTATIN CALCIUM 5 MG PO TABS
5.0000 mg | ORAL_TABLET | Freq: Every day | ORAL | 3 refills | Status: DC
  Filled 2023-12-21: qty 30, 30d supply, fill #0
  Filled 2024-01-20: qty 30, 30d supply, fill #1
  Filled 2024-02-26: qty 90, 90d supply, fill #2
  Filled 2024-06-04: qty 90, 90d supply, fill #3

## 2023-12-21 MED ORDER — FLUTICASONE PROPIONATE 50 MCG/ACT NA SUSP
1.0000 | Freq: Two times a day (BID) | NASAL | 2 refills | Status: AC
  Filled 2023-12-21: qty 16, 30d supply, fill #0
  Filled 2024-06-04: qty 16, 30d supply, fill #1

## 2023-12-21 MED ORDER — LISINOPRIL 20 MG PO TABS
20.0000 mg | ORAL_TABLET | Freq: Every day | ORAL | 1 refills | Status: DC
  Filled 2023-12-21: qty 30, 30d supply, fill #0

## 2023-12-21 MED ORDER — OMEPRAZOLE 20 MG PO CPDR
20.0000 mg | DELAYED_RELEASE_CAPSULE | Freq: Every day | ORAL | 1 refills | Status: AC
  Filled 2023-12-21: qty 30, 30d supply, fill #0
  Filled 2024-05-28: qty 30, 30d supply, fill #1

## 2023-12-21 NOTE — Assessment & Plan Note (Signed)
 Hyperlipidemia management is complicated by issues with pharmacy filling Crestor  prescription. Current medication is Crestor  5 mg daily, but unable to obtain from [facility]. - Switch pharmacy to the hospital pharmacy for Crestor  prescription. - Reassess cholesterol levels at next visit.

## 2023-12-21 NOTE — Assessment & Plan Note (Signed)
 Type 2 diabetes mellitus with recent A1c of 9.1. Current treatment includes Mounjaro  7.5 mg weekly and metformin  XR 1000 mg daily. Weight loss is slow but steady. No significant side effects from Mounjaro  reported. - Increase Mounjaro  to 10 mg weekly. - Recheck A1c today. - Continue metformin  XR 1000 mg daily. - Emphasize weight management and medication adherence.

## 2023-12-21 NOTE — Assessment & Plan Note (Signed)
 Obesity with recent weight loss of 15 pounds. Current management includes Mounjaro  and a high-protein diet with probiotic juices. - Continue Mounjaro  with increased dose to 10 mg weekly. - Encourage continuation of high-protein diet and probiotic intake.

## 2023-12-21 NOTE — Assessment & Plan Note (Signed)
 Hypertension is well-controlled with current medications. Recent blood pressure reading was 128/78 mmHg. - Continue hydrochlorothiazide  25 mg daily. - Continue lisinopril  20 mg daily. - Recheck blood pressure at next visit.

## 2023-12-21 NOTE — Progress Notes (Signed)
 Complete physical exam   Patient: Jeremy David   DOB: 05-18-1991   33 y.o. Male  MRN: [id number] Visit Date: 12/21/2023  Today's healthcare provider: Jon Eva, MD   Chief Complaint  Patient presents with   Annual Exam    Last completed 03/08/21 Diet - High Protein Exercise - walk more at work  Feeling - well Sleeping - fairly well Concerns - bilateral hearing issues    Care Management    Foot exam due since 12/09/23 HIV Screening declined Pneumococcal Vaccine ok to administer Hepatitis B Vaccines  HPV Vaccines - would like information    Hearing Problem    Pt reports he has noticed in the last 6 months has noticed ears are ringing more and having a harder time hearing what people are saying due to muffled sound. Reports family hx of tinnitus   Subjective    Jeremy David is a 33 y.o. male who presents today for a complete physical exam.    Discussed the use of AI scribe software for clinical note transcription with the patient, who gave verbal consent to proceed.  History of Present Illness   Jeremy David is a 33 year old male with hypertension and diabetes who presents for medication management and neck pain.  He has achieved significant improvement in blood pressure after losing 15 pounds. Current medications include hydrochlorothiazide  25 mg and lisinopril  20 mg. Recent blood pressure measurements show improvement. For diabetes, he takes Mounjaro  7.5 mg and metformin  XR 1000 mg daily without side effects, though he occasionally faces difficulty obtaining Mounjaro . He also takes Crestor  5 mg for cholesterol management but has had issues with prescription fulfillment at [facility].  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 He seeks a stronger alternative to Flexeril , which he uses as needed, and continues to take meloxicam  for pain management.  He follows a high-protein  diet and drinks probiotic juices, noting improved clarity and bowel movements with increased frequency. He inquires about testosterone levels due to a lack of drive affecting his relationship. He has not had testosterone levels checked previously.  He has a history of leg swelling, which has improved but still presents with some residual spots, more pronounced in one leg.        Last depression screening scores    12/21/2023    9:29 AM 06/08/2023    3:10 PM 12/09/2022    8:21 AM  PHQ 2/9 Scores  PHQ - 2 Score 2 1 2   PHQ- 9 Score 7  5   Last fall risk screening    12/21/2023    9:30 AM  Fall Risk   Falls in the past year? 1  Number falls in past yr: 1  Injury with Fall? 1  Risk for fall due to : History of fall(s)  Follow up Falls evaluation completed        Medications: Outpatient Medications Prior to Visit  Medication Sig   [DISCONTINUED] cyclobenzaprine  (FLEXERIL ) 10 MG tablet TAKE 1 TABLET(10 MG) BY MOUTH THREE TIMES DAILY AS NEEDED FOR MUSCLE SPASMS   [DISCONTINUED] fluticasone  (FLONASE ) 50 MCG/ACT nasal spray Place 1 spray into both nostrils 2 (two) times daily.   [DISCONTINUED] hydrochlorothiazide  (HYDRODIURIL ) 25 MG tablet TAKE 1 TABLET(25 MG) BY MOUTH DAILY   [DISCONTINUED] lisinopril  (ZESTRIL ) 20 MG tablet Take 1 tablet (20 mg total) by mouth daily. TAKE 1 TABLET(20 MG) BY MOUTH DAILY   [DISCONTINUED] meloxicam  (MOBIC ) 15 MG  tablet Take 1 tablet (15 mg total) by mouth daily.   [DISCONTINUED] metFORMIN  (GLUCOPHAGE -XR) 500 MG 24 hr tablet TAKE 2 TABLETS(1000 MG) BY MOUTH DAILY WITH BREAKFAST   [DISCONTINUED] omeprazole  (PRILOSEC) 20 MG capsule TAKE 1 CAPSULE(20 MG) BY MOUTH DAILY   [DISCONTINUED] rosuvastatin  (CRESTOR ) 5 MG tablet Take 1 tablet (5 mg total) by mouth daily.   [DISCONTINUED] sertraline  (ZOLOFT ) 100 MG tablet TAKE 1 AND 1/2 TABLETS(150 MG) BY MOUTH DAILY   [DISCONTINUED] tirzepatide  (MOUNJARO ) 7.5 MG/0.5ML Pen Inject 7.5 mg into the skin once a week.    No facility-administered medications prior to visit.    Review of Systems    Objective    BP 128/78 (BP Location: Right Arm, Patient Position: Sitting, Cuff Size: Large)   Pulse 96   Ht 5' 10 (1.778 m)   Wt (!) 460 lb 4.8 oz (208.8 kg)   SpO2 96%   BMI 66.05 kg/m    Physical Exam Vitals reviewed.  Constitutional:      General: He is not in acute distress.    Appearance: Normal appearance. He is well-developed. He is not diaphoretic.  HENT:     Head: Normocephalic and atraumatic.     Right Ear: Tympanic membrane, ear canal and external ear normal.     Left Ear: Tympanic membrane, ear canal and external ear normal.     Nose: Nose normal.     Mouth/Throat:     Mouth: Mucous membranes are moist.     Pharynx: Oropharynx is clear. No oropharyngeal exudate.  Eyes:     General: No scleral icterus.    Conjunctiva/sclera: Conjunctivae normal.     Pupils: Pupils are equal, round, and reactive to light.  Neck:     Thyroid : No thyromegaly.  Cardiovascular:     Rate and Rhythm: Normal rate and regular rhythm.     Heart sounds: Normal heart sounds. No murmur heard. Pulmonary:     Effort: Pulmonary effort is normal. No respiratory distress.     Breath sounds: Normal breath sounds. No wheezing or rales.  Abdominal:     General: There is no distension.     Palpations: Abdomen is soft.     Tenderness: There is no abdominal tenderness.  Musculoskeletal:     Cervical back: Neck supple.     Right lower leg: Edema present.     Left lower leg: Edema present.  Lymphadenopathy:     Cervical: No cervical adenopathy.  Skin:    General: Skin is warm and dry.     Findings: No rash.  Neurological:     Mental Status: He is alert and oriented to person, place, and time. Mental status is at baseline.     Gait: Gait normal.  Psychiatric:        Mood and Affect: Mood normal.        Behavior: Behavior normal.        Thought Content: Thought content normal.      No results found for  any visits on 12/21/23.  Assessment & Plan    Routine Health Maintenance and Physical Exam  Exercise Activities and Dietary recommendations  Goals   None     Immunization History  Administered Date(s) Administered   DTP 05/17/1989, 07/12/1989, 12/19/1989, 09/24/1990, 04/15/1993   DTaP 05/17/1989, 12/19/1989, 05/11/1990, 09/24/1990, 04/15/1993   HIB (PRP-OMP) 07/02/1989, 12/19/1989, 03/01/1990, 07/03/1990   HIB, Unspecified 07/02/1989, 12/19/1989, 03/01/1990, 07/03/1990   Influenza, Seasonal, Injecte, Preservative Fre 06/08/2023   MMR 07/03/1990, 04/15/1993   OPV  05/17/1989, 07/12/1989, 05/11/1990, 09/24/1990, 04/15/1993   PFIZER(Purple Top)SARS-COV-2 Vaccination 08/04/2019, 08/26/2019   PNEUMOCOCCAL CONJUGATE-20 12/21/2023   Tdap 12/05/2019    Health Maintenance  Topic Date Due   HIV Screening  Never done   Hepatitis B Vaccines (1 of 3 - 19+ 3-dose series) Never done   HPV VACCINES (1 - 3-dose SCDM series) Never done   COVID-19 Vaccine (3 - 2024-25 season) 01/29/2023   INFLUENZA VACCINE  12/29/2023   HEMOGLOBIN A1C  03/08/2024   OPHTHALMOLOGY EXAM  06/27/2024   Diabetic kidney evaluation - Urine ACR  09/06/2024   Diabetic kidney evaluation - eGFR measurement  09/11/2024   FOOT EXAM  12/20/2024   DTaP/Tdap/Td (7 - Td or Tdap) 12/04/2029   Pneumococcal Vaccine 38-4 Years old  Completed   Hepatitis C Screening  Completed   Meningococcal B Vaccine  Aged Out    Discussed health benefits of physical activity, and encouraged him to engage in regular exercise appropriate for his age and condition.  Problem List Items Addressed This Visit       Cardiovascular and Mediastinum   Hypertension associated with diabetes (HCC)   Hypertension is well-controlled with current medications. Recent blood pressure reading was 128/78 mmHg. - Continue hydrochlorothiazide  25 mg daily. - Continue lisinopril  20 mg daily. - Recheck blood pressure at next visit.      Relevant Medications    lisinopril  (ZESTRIL ) 20 MG tablet   hydrochlorothiazide  (HYDRODIURIL ) 25 MG tablet   metFORMIN  (GLUCOPHAGE -XR) 500 MG 24 hr tablet   rosuvastatin  (CRESTOR ) 5 MG tablet   tirzepatide  (MOUNJARO ) 10 MG/0.5ML Pen   Other Relevant Orders   Comprehensive metabolic panel with GFR     Endocrine   T2DM (type 2 diabetes mellitus) (HCC)   Type 2 diabetes mellitus with recent A1c of 9.1. Current treatment includes Mounjaro  7.5 mg weekly and metformin  XR 1000 mg daily. Weight loss is slow but steady. No significant side effects from Mounjaro  reported. - Increase Mounjaro  to 10 mg weekly. - Recheck A1c today. - Continue metformin  XR 1000 mg daily. - Emphasize weight management and medication adherence.      Relevant Medications   lisinopril  (ZESTRIL ) 20 MG tablet   metFORMIN  (GLUCOPHAGE -XR) 500 MG 24 hr tablet   rosuvastatin  (CRESTOR ) 5 MG tablet   tirzepatide  (MOUNJARO ) 10 MG/0.5ML Pen   Other Relevant Orders   Hemoglobin A1c   Hyperlipidemia associated with type 2 diabetes mellitus (HCC)   Hyperlipidemia management is complicated by issues with pharmacy filling Crestor  prescription. Current medication is Crestor  5 mg daily, but unable to obtain from [facility]. - Switch pharmacy to the hospital pharmacy for Crestor  prescription. - Reassess cholesterol levels at next visit.      Relevant Medications   lisinopril  (ZESTRIL ) 20 MG tablet   hydrochlorothiazide  (HYDRODIURIL ) 25 MG tablet   metFORMIN  (GLUCOPHAGE -XR) 500 MG 24 hr tablet   rosuvastatin  (CRESTOR ) 5 MG tablet   tirzepatide  (MOUNJARO ) 10 MG/0.5ML Pen     Other   Morbid obesity (HCC)   Obesity with recent weight loss of 15 pounds. Current management includes Mounjaro  and a high-protein diet with probiotic juices. - Continue Mounjaro  with increased dose to 10 mg weekly. - Encourage continuation of high-protein diet and probiotic intake.      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 Current treatment includes meloxicam  and Flexeril , but pain has increased. Problem is more mechanical and a different muscle relaxer may provide temporary relief. - Prescribe Skelaxin  as a stronger muscle relaxer. - Provide neck exercises via MyChart. - Advise use of heating pad before exercises and ice afterward. - Encourage posture correction and regular stretching during work hours.  Suspected testosterone deficiency Suspected testosterone deficiency with symptoms of low libido. No previous testosterone level checked. - Order morning testosterone level. - Consider referral to urology if testosterone is low.  General Health Maintenance Pneumonia vaccination is due, and hepatitis B immunity status is uncertain. - Administer pneumonia vaccine today. - Add hepatitis B titer to blood work to confirm immunity.       Return in about 3 months (around 03/22/2024) for chronic disease f/u.     Jon Eva, MD  [facility] [phone] (phone) [phone] (fax)  [facility]

## 2024-01-03 ENCOUNTER — Other Ambulatory Visit: Payer: Self-pay | Admitting: Family Medicine

## 2024-01-20 ENCOUNTER — Other Ambulatory Visit: Payer: Self-pay

## 2024-01-22 ENCOUNTER — Telehealth: Payer: Self-pay

## 2024-01-22 ENCOUNTER — Other Ambulatory Visit: Payer: Self-pay

## 2024-01-22 ENCOUNTER — Encounter: Payer: Self-pay | Admitting: Family Medicine

## 2024-01-22 DIAGNOSIS — E1159 Type 2 diabetes mellitus with other circulatory complications: Secondary | ICD-10-CM

## 2024-01-24 ENCOUNTER — Other Ambulatory Visit: Payer: Self-pay

## 2024-01-24 MED ORDER — LISINOPRIL 20 MG PO TABS
20.0000 mg | ORAL_TABLET | Freq: Every day | ORAL | 1 refills | Status: AC
  Filled 2024-01-24: qty 90, 90d supply, fill #0
  Filled 2024-04-26 – 2024-05-28 (×2): qty 90, 90d supply, fill #1

## 2024-01-24 NOTE — Telephone Encounter (Signed)
 error

## 2024-01-25 NOTE — Telephone Encounter (Signed)
 Both pharmacies has been removed form the pt chart as requested

## 2024-02-26 ENCOUNTER — Other Ambulatory Visit: Payer: Self-pay

## 2024-03-06 ENCOUNTER — Other Ambulatory Visit: Payer: Self-pay

## 2024-03-06 MED ORDER — AMOXICILLIN 875 MG PO TABS
875.0000 mg | ORAL_TABLET | Freq: Two times a day (BID) | ORAL | 0 refills | Status: DC
  Filled 2024-03-06: qty 14, 7d supply, fill #0

## 2024-03-13 ENCOUNTER — Other Ambulatory Visit: Payer: Self-pay | Admitting: Family Medicine

## 2024-03-26 ENCOUNTER — Ambulatory Visit: Admitting: Family Medicine

## 2024-04-27 ENCOUNTER — Other Ambulatory Visit: Payer: Self-pay

## 2024-05-07 ENCOUNTER — Ambulatory Visit: Admitting: Family Medicine

## 2024-05-09 ENCOUNTER — Other Ambulatory Visit: Payer: Self-pay

## 2024-05-28 ENCOUNTER — Encounter: Payer: Self-pay | Admitting: Family Medicine

## 2024-05-28 ENCOUNTER — Other Ambulatory Visit: Payer: Self-pay

## 2024-05-28 ENCOUNTER — Ambulatory Visit: Admitting: Family Medicine

## 2024-05-28 VITALS — BP 148/84 | HR 86 | Resp 16 | Ht 70.0 in | Wt >= 6400 oz

## 2024-05-28 DIAGNOSIS — F3341 Major depressive disorder, recurrent, in partial remission: Secondary | ICD-10-CM

## 2024-05-28 DIAGNOSIS — I152 Hypertension secondary to endocrine disorders: Secondary | ICD-10-CM | POA: Diagnosis not present

## 2024-05-28 DIAGNOSIS — K219 Gastro-esophageal reflux disease without esophagitis: Secondary | ICD-10-CM | POA: Diagnosis not present

## 2024-05-28 DIAGNOSIS — F411 Generalized anxiety disorder: Secondary | ICD-10-CM | POA: Diagnosis not present

## 2024-05-28 DIAGNOSIS — E1169 Type 2 diabetes mellitus with other specified complication: Secondary | ICD-10-CM

## 2024-05-28 DIAGNOSIS — M79674 Pain in right toe(s): Secondary | ICD-10-CM | POA: Diagnosis not present

## 2024-05-28 DIAGNOSIS — E785 Hyperlipidemia, unspecified: Secondary | ICD-10-CM

## 2024-05-28 DIAGNOSIS — R41840 Attention and concentration deficit: Secondary | ICD-10-CM

## 2024-05-28 DIAGNOSIS — E1159 Type 2 diabetes mellitus with other circulatory complications: Secondary | ICD-10-CM

## 2024-05-28 DIAGNOSIS — Z7985 Long-term (current) use of injectable non-insulin antidiabetic drugs: Secondary | ICD-10-CM | POA: Diagnosis not present

## 2024-05-28 DIAGNOSIS — Z23 Encounter for immunization: Secondary | ICD-10-CM

## 2024-05-28 NOTE — Assessment & Plan Note (Addendum)
 Uncontrolled Has been off of lisinopril  due to not picking up refill Will resume lisinopril  and continue hydrochlorothiazide  Monitor home BPs Recheck metabolic panel

## 2024-05-28 NOTE — Assessment & Plan Note (Signed)
 Mood and anxiety well-managed with sertraline . Inconsistent use of sertraline  noted, with intake primarily during work hours. Emphasized importance of daily intake for efficacy. - Advised taking sertraline  daily for consistent efficacy. - Monitor mood and anxiety symptoms.

## 2024-05-28 NOTE — Assessment & Plan Note (Signed)
 Recent exacerbation of acid reflux symptoms, improved with omeprazole . Mounjaro  may contribute to symptoms. - Continue omeprazole  as needed for acid reflux.

## 2024-05-28 NOTE — Assessment & Plan Note (Signed)
 Weight trending downward with current treatment regimen including Mounjaro . No significant side effects reported except for occasional nausea when resuming Mounjaro  after a break. - Continue current weight management regimen with Mounjaro . - Monitor weight and adjust treatment as needed.

## 2024-05-28 NOTE — Assessment & Plan Note (Signed)
 Type 2 diabetes with recent A1c of 9.1%. Blood pressure management interrupted due to missed lisinopril . Hyperlipidemia managed with Crestor . Weight trending downward with Mounjaro . Potential to increase Mounjaro  dose if needed. Blood pressure goal is 120s/130s. - Rechecked A1c, cholesterol, kidney, and liver function. - Resume lisinopril  and monitor blood pressure at home. - Will consider increasing Mounjaro  dose if A1c remains high. - Advised bringing home blood pressure monitor to next visit for calibration check.

## 2024-05-28 NOTE — Assessment & Plan Note (Signed)
 Anxiety symptoms managed with sertraline . Inconsistent use noted, with intake primarily during work hours. Emphasized importance of daily intake for efficacy. - Advised taking sertraline  daily for consistent efficacy.

## 2024-05-28 NOTE — Assessment & Plan Note (Signed)
Previously uncontrolled Continue statin Repeat FLP and CMP Goal LDL < 70

## 2024-05-28 NOTE — Progress Notes (Signed)
 "     Established patient visit   Patient: Jeremy David   DOB: 23-Feb-1991   33 y.o. Male  MRN: [id number] Visit Date: 05/28/2024  Today's healthcare provider: Jon Eva, MD   Chief Complaint  Patient presents with   Medical Management of Chronic Issues   Toe Pain    R 5th finger toe injury on the 15th. Pt kicked drill battery    Subjective    Toe Pain    HPI     Toe Pain    Additional comments: R 5th finger toe injury on the 15th. Pt kicked drill battery       Last edited by Wilfred Hargis RAMAN, CMA on 05/28/2024  8:11 AM.       Discussed the use of AI scribe software for clinical note transcription with the patient, who gave verbal consent to proceed.  History of Present Illness   Jeremy David is a 33 year old male who presents for a follow-up visit.  He injured his baby toe on December 15 by kicking an object, with persistent pain and swelling. Pain worsens with direct pressure, such as when his dog steps on it, but he walks without significant discomfort.  He takes hydrochlorothiazide  25 mg daily, metformin  XR 1000 mg daily, Crestor  5 mg daily, Zoloft  150 mg daily, and Mounjaro  10 mg weekly. He uses meloxicam  and Skelaxin  as needed. He has a small bruise at a recent Mounjaro  injection site and is considering alternative injection locations. After a break from Mounjaro  he had nausea when restarting and has had weight loss. He is not taking lisinopril  because he has not picked it up.  He takes Zoloft  inconsistently, mainly on workdays. He is interested in ADHD medication due to difficulty focusing at work and had ADHD in childhood.  He restarted an over-the-counter Prilosec equivalent for acid reflux, which controls his symptoms.          05/28/2024    8:17 AM 12/21/2023    9:29 AM 06/08/2023    3:10 PM 04/08/2021    2:41 PM  GAD 7 : Generalized Anxiety Score  Nervous, Anxious, on Edge 0 1 1 1   Control/stop worrying 0 0 0 1  Worry too much - different things  1 0 0 3  Trouble relaxing 0 0 0 1  Restless 0 1 0 0  Easily annoyed or irritable 1 1 1  0  Afraid - awful might happen 1 0 0 1  Total GAD 7 Score 3 3 2 7   Anxiety Difficulty Not difficult at all Somewhat difficult Not difficult at all Somewhat difficult        05/28/2024    8:17 AM 12/21/2023    9:29 AM 06/08/2023    3:10 PM 12/09/2022    8:21 AM 02/22/2022    9:17 AM  Depression screen PHQ 2/9  Decreased Interest 0 1 0 1 1  Down, Depressed, Hopeless 1 1 1 1 1   PHQ - 2 Score 1 2 1 2 2   Altered sleeping 0 0  1 1  Tired, decreased energy 1 1  1 1   Change in appetite 1 1  1 1   Feeling bad or failure about yourself  0 1  0 0  Trouble concentrating 0 1  0 1  Moving slowly or fidgety/restless 0 1  0 0  Suicidal thoughts 0 0  0 0  PHQ-9 Score 3 7   5  6    Difficult doing work/chores Somewhat difficult Somewhat difficult  Somewhat difficult Somewhat difficult     Data saved with a previous flowsheet row definition     Medications: Show/hide medication list[1]  Review of Systems     Objective    BP (!) 148/84   Pulse 86   Resp 16   Ht 5' 10 (1.778 m)   Wt (!) 457 lb 12.8 oz (207.7 kg)   SpO2 99%   BMI 65.69 kg/m    Physical Exam Vitals reviewed.  Constitutional:      General: He is not in acute distress.    Appearance: Normal appearance. He is not diaphoretic.  HENT:     Head: Normocephalic and atraumatic.  Eyes:     General: No scleral icterus.    Conjunctiva/sclera: Conjunctivae normal.  Cardiovascular:     Rate and Rhythm: Normal rate and regular rhythm.     Heart sounds: Normal heart sounds. No murmur heard. Pulmonary:     Effort: Pulmonary effort is normal. No respiratory distress.     Breath sounds: Normal breath sounds. No wheezing or rhonchi.  Musculoskeletal:     Cervical back: Neck supple.     Right lower leg: No edema.     Left lower leg: No edema.     Comments: No TTP over R 5th metatarsal. TTP over R 5th toe  Lymphadenopathy:     Cervical: No  cervical adenopathy.  Skin:    General: Skin is warm and dry.  Neurological:     Mental Status: He is alert and oriented to person, place, and time. Mental status is at baseline.  Psychiatric:        Mood and Affect: Mood normal.        Behavior: Behavior normal.      No results found for any visits on 05/28/24.  Assessment & Plan     Problem List Items Addressed This Visit       Cardiovascular and Mediastinum   Hypertension associated with diabetes (HCC) - Primary   Uncontrolled Has been off of lisinopril  due to not picking up refill Will resume lisinopril  and continue hydrochlorothiazide  Monitor home BPs Recheck metabolic panel        Digestive   GERD (gastroesophageal reflux disease)   Recent exacerbation of acid reflux symptoms, improved with omeprazole . Mounjaro  may contribute to symptoms. - Continue omeprazole  as needed for acid reflux.        Endocrine   T2DM (type 2 diabetes mellitus) (HCC)   Type 2 diabetes with recent A1c of 9.1%. Blood pressure management interrupted due to missed lisinopril . Hyperlipidemia managed with Crestor . Weight trending downward with Mounjaro . Potential to increase Mounjaro  dose if needed. Blood pressure goal is 120s/130s. - Rechecked A1c, cholesterol, kidney, and liver function. - Resume lisinopril  and monitor blood pressure at home. - Will consider increasing Mounjaro  dose if A1c remains high. - Advised bringing home blood pressure monitor to next visit for calibration check.      Hyperlipidemia associated with type 2 diabetes mellitus (HCC)   Previously uncontrolled Continue statin Repeat FLP and CMP Goal LDL < 70      Relevant Orders   Lipid panel     Other   Morbid obesity (HCC)   Weight trending downward with current treatment regimen including Mounjaro . No significant side effects reported except for occasional nausea when resuming Mounjaro  after a break. - Continue current weight management regimen with Mounjaro . -  Monitor weight and adjust treatment as needed.      MDD (major depressive disorder)  Mood and anxiety well-managed with sertraline . Inconsistent use of sertraline  noted, with intake primarily during work hours. Emphasized importance of daily intake for efficacy. - Advised taking sertraline  daily for consistent efficacy. - Monitor mood and anxiety symptoms.      GAD (generalized anxiety disorder)   Anxiety symptoms managed with sertraline . Inconsistent use noted, with intake primarily during work hours. Emphasized importance of daily intake for efficacy. - Advised taking sertraline  daily for consistent efficacy.      Other Visit Diagnoses       Toe pain, right         Immunization due       Relevant Orders   Flu vaccine trivalent PF, 6mos and older(Flulaval,Afluria,Fluarix,Fluzone) (Completed)     Attention and concentration deficit       Relevant Orders   Ambulatory referral to Neuropsychology           Right toe pain, possible fracture Right toe pain following trauma on December 15th, 2025. Pain is significant with pressure but not during walking. No pain in the midfoot or foot bone, suggesting isolated toe bone involvement. Differential includes toe bone fracture versus foot bone fracture. X-ray considered but deferred due to lack of midfoot pain and ability to walk without significant discomfort. - Monitor symptoms for a few more weeks. - If pain persists, will consider x-ray to rule out fracture. - Advised wearing shoes to protect the toe.  Attention and concentration deficit Reports difficulty focusing, especially at work. No formal ADHD diagnosis as a child. Discussed need for formal diagnosis for adult ADHD treatment. - Referred to neuropsychology for ADHD testing. - Advised checking with pediatrician for childhood records.  General health maintenance Received flu shot today.        Return in about 3 months (around 08/26/2024) for chronic disease f/u.        Jon Eva, MD  [facility] [phone] (phone) [phone] (fax)  [facility]     [1]  Outpatient Medications Prior to Visit  Medication Sig   fluticasone  (FLONASE ) 50 MCG/ACT nasal spray Place 1 spray into both nostrils 2 (two) times daily.   hydrochlorothiazide  (HYDRODIURIL ) 25 MG tablet Take 1 tablet (25 mg total) by mouth daily.   lisinopril  (ZESTRIL ) 20 MG tablet Take 1 tablet (20 mg total) by mouth daily.   meloxicam  (MOBIC ) 15 MG tablet Take 1 tablet (15 mg total) by mouth daily.   metaxalone  (SKELAXIN ) 800 MG tablet Take 1 tablet (800 mg total) by mouth 3 (three) times daily as needed for muscle spasms.   metFORMIN  (GLUCOPHAGE -XR) 500 MG 24 hr tablet TAKE 2 TABLETS(1000 MG) BY MOUTH DAILY WITH BREAKFAST   omeprazole  (PRILOSEC) 20 MG capsule Take 1 capsule (20 mg total) by mouth daily.   rosuvastatin  (CRESTOR ) 5 MG tablet Take 1 tablet (5 mg total) by mouth daily.   sertraline  (ZOLOFT ) 100 MG tablet Take 1.5 tablets (150 mg total) by mouth daily.   tirzepatide  (MOUNJARO ) 10 MG/0.5ML Pen Inject 10 mg into the skin once a week.   [DISCONTINUED] amoxicillin  (AMOXIL ) 875 MG tablet Take 1 tablet (875 mg total) by mouth 2 (two) times daily.   No facility-administered medications prior to visit.   "

## 2024-05-29 LAB — COMPREHENSIVE METABOLIC PANEL WITH GFR
ALT: 67 IU/L — ABNORMAL HIGH (ref 0–44)
AST: 45 IU/L — ABNORMAL HIGH (ref 0–40)
Albumin: 4.3 g/dL (ref 4.1–5.1)
Alkaline Phosphatase: 23 IU/L — ABNORMAL LOW (ref 47–123)
BUN/Creatinine Ratio: 12 (ref 9–20)
BUN: 9 mg/dL (ref 6–20)
Bilirubin Total: 1.4 mg/dL — ABNORMAL HIGH (ref 0.0–1.2)
CO2: 21 mmol/L (ref 20–29)
Calcium: 9.4 mg/dL (ref 8.7–10.2)
Chloride: 100 mmol/L (ref 96–106)
Creatinine, Ser: 0.73 mg/dL — ABNORMAL LOW (ref 0.76–1.27)
Globulin, Total: 2.5 g/dL (ref 1.5–4.5)
Glucose: 225 mg/dL — ABNORMAL HIGH (ref 70–99)
Potassium: 4 mmol/L (ref 3.5–5.2)
Sodium: 139 mmol/L (ref 134–144)
Total Protein: 6.8 g/dL (ref 6.0–8.5)
eGFR: 122 mL/min/1.73

## 2024-05-29 LAB — LIPID PANEL
Chol/HDL Ratio: 4.1 ratio (ref 0.0–5.0)
Cholesterol, Total: 178 mg/dL (ref 100–199)
HDL: 43 mg/dL
LDL Chol Calc (NIH): 113 mg/dL — ABNORMAL HIGH (ref 0–99)
Triglycerides: 121 mg/dL (ref 0–149)
VLDL Cholesterol Cal: 22 mg/dL (ref 5–40)

## 2024-05-29 LAB — TESTOSTERONE,FREE AND TOTAL
Testosterone, Free: 9.7 pg/mL (ref 8.7–25.1)
Testosterone: 192 ng/dL — ABNORMAL LOW (ref 264–916)

## 2024-05-29 LAB — HEMOGLOBIN A1C
Est. average glucose Bld gHb Est-mCnc: 235 mg/dL
Hgb A1c MFr Bld: 9.8 % — ABNORMAL HIGH (ref 4.8–5.6)

## 2024-05-29 LAB — HEPATITIS B SURFACE ANTIBODY,QUALITATIVE: Hep B Surface Ab, Qual: NONREACTIVE

## 2024-05-31 ENCOUNTER — Other Ambulatory Visit: Payer: Self-pay

## 2024-05-31 ENCOUNTER — Ambulatory Visit: Payer: Self-pay

## 2024-05-31 DIAGNOSIS — E1169 Type 2 diabetes mellitus with other specified complication: Secondary | ICD-10-CM

## 2024-05-31 DIAGNOSIS — R7989 Other specified abnormal findings of blood chemistry: Secondary | ICD-10-CM

## 2024-05-31 MED ORDER — TIRZEPATIDE 12.5 MG/0.5ML ~~LOC~~ SOAJ
12.5000 mg | SUBCUTANEOUS | 3 refills | Status: AC
  Filled 2024-05-31: qty 2, 28d supply, fill #0

## 2024-06-04 ENCOUNTER — Other Ambulatory Visit: Payer: Self-pay

## 2024-06-04 DIAGNOSIS — E1169 Type 2 diabetes mellitus with other specified complication: Secondary | ICD-10-CM

## 2024-06-04 MED ORDER — ROSUVASTATIN CALCIUM 10 MG PO TABS
10.0000 mg | ORAL_TABLET | Freq: Every day | ORAL | 1 refills | Status: AC
  Filled 2024-06-04: qty 30, 30d supply, fill #0

## 2024-06-05 ENCOUNTER — Encounter: Payer: Self-pay | Admitting: Family Medicine

## 2024-06-05 ENCOUNTER — Other Ambulatory Visit: Payer: Self-pay

## 2024-06-05 DIAGNOSIS — E1169 Type 2 diabetes mellitus with other specified complication: Secondary | ICD-10-CM

## 2024-06-05 MED ORDER — METFORMIN HCL ER 500 MG PO TB24
1000.0000 mg | ORAL_TABLET | Freq: Every day | ORAL | 1 refills | Status: AC
  Filled 2024-06-05: qty 60, 30d supply, fill #0

## 2024-06-05 NOTE — Addendum Note (Signed)
 Addended by: LILIAN SEVERO RAMAN on: 06/05/2024 02:30 PM   Modules accepted: Orders

## 2024-08-27 ENCOUNTER — Ambulatory Visit: Admitting: Family Medicine
